# Patient Record
Sex: Male | Born: 1985
Health system: Southern US, Community
[De-identification: ages and names within clinical notes are randomized; demographics above are authoritative.]

## PROBLEM LIST (undated history)

## (undated) DIAGNOSIS — K219 Gastro-esophageal reflux disease without esophagitis: Secondary | ICD-10-CM

## (undated) HISTORY — DX: Gastro-esophageal reflux disease without esophagitis: K21.9

## (undated) HISTORY — PX: KNEE ARTHROSCOPY: SUR90

## (undated) HISTORY — PX: WISDOM TOOTH EXTRACTION: SHX21

## (undated) HISTORY — PX: VASECTOMY: SHX75

---

## 2013-01-17 ENCOUNTER — Ambulatory Visit: Payer: 59

## 2013-01-17 ENCOUNTER — Ambulatory Visit (INDEPENDENT_AMBULATORY_CARE_PROVIDER_SITE_OTHER): Payer: 59 | Admitting: Physician Assistant

## 2013-01-17 VITALS — BP 126/72 | HR 73 | Temp 98.6°F | Resp 16 | Ht 69.0 in | Wt 178.0 lb

## 2013-01-17 DIAGNOSIS — J189 Pneumonia, unspecified organism: Secondary | ICD-10-CM

## 2013-01-17 DIAGNOSIS — R059 Cough, unspecified: Secondary | ICD-10-CM

## 2013-01-17 DIAGNOSIS — R0602 Shortness of breath: Secondary | ICD-10-CM

## 2013-01-17 DIAGNOSIS — R05 Cough: Secondary | ICD-10-CM

## 2013-01-17 LAB — POCT CBC
Granulocyte percent: 72.1 %G (ref 37–80)
HCT, POC: 46.6 % (ref 43.5–53.7)
Hemoglobin: 14.6 g/dL (ref 14.1–18.1)
LYMPH, POC: 2.8 (ref 0.6–3.4)
MCH, POC: 29.3 pg (ref 27–31.2)
MCHC: 31.3 g/dL — AB (ref 31.8–35.4)
MCV: 93.3 fL (ref 80–97)
MID (cbc): 1.1 — AB (ref 0–0.9)
MPV: 9.2 fL (ref 0–99.8)
POC Granulocyte: 9.9 — AB (ref 2–6.9)
POC LYMPH %: 20.1 % (ref 10–50)
POC MID %: 7.8 %M (ref 0–12)
Platelet Count, POC: 280 10*3/uL (ref 142–424)
RBC: 4.99 M/uL (ref 4.69–6.13)
RDW, POC: 13 %
WBC: 13.8 10*3/uL — AB (ref 4.6–10.2)

## 2013-01-17 MED ORDER — ALBUTEROL SULFATE HFA 108 (90 BASE) MCG/ACT IN AERS: 2.0000 | INHALATION_SPRAY | RESPIRATORY_TRACT | Status: DC | PRN

## 2013-01-17 MED ORDER — LEVOFLOXACIN 500 MG PO TABS: 500.0000 mg | ORAL_TABLET | Freq: Every day | ORAL | Status: DC

## 2013-01-17 MED ORDER — HYDROCODONE-HOMATROPINE 5-1.5 MG/5ML PO SYRP: ORAL_SOLUTION | ORAL | Status: DC

## 2013-01-17 MED ORDER — CEFTRIAXONE SODIUM 1 G IJ SOLR
1.0000 g | Freq: Once | INTRAMUSCULAR | Status: AC
  Administered 2013-01-17: 1 g via INTRAMUSCULAR

## 2013-01-17 MED ORDER — E-Z SPACER DEVI: Status: DC

## 2013-01-17 NOTE — Progress Notes (Signed)
Subjective:    Patient ID: Alvin Johnson, male    DOB: 05-30-1985, 28 y.o.   MRN: [id number]  HPI 28 year old male presents with a 7 day history of nasal congestion, post nasal drip, sore throat, and cough. Mild sinus pressure. Subjective fever. No chills. Mild nasal congestion. Cough is productive of green/yellow sputum and worse first thing in the morning. Cough gets better as the day progresses. Some SOB. No wheezing. Some sinus pressure along the bilateral frontal sinuses. Ears feel full, leading to sensation of muffled hearing. Has tried OTC cold preps without success. Mild diarrhea for the first day or two. Appetite normal. Multiple sick contacts. No recent antibiotics, or recent travels.   No leg trauma, sedentary periods, h/o cancer, or tobacco use. No swelling in his legs.   He did receive an influenza vaccine this year.  PMH: History reviewed. No pertinent past medical history.   Home Meds: Prior to Admission medications   Not on File    Allergies: No Known Allergies  History   Social History  . Marital Status: Married    Spouse Name: N/A    Number of Children: N/A  . Years of Education: N/A   Occupational History  . Not on file.   Social History Main Topics  . Smoking status: Never Smoker   . Smokeless tobacco: Not on file  . Alcohol Use: Yes  . Drug Use: No  . Sexual Activity: Not on file   Other Topics Concern  . Not on file   Social History Narrative  . No narrative on file      Review of Systems  Constitutional: Positive for fever and fatigue. Negative for chills and appetite change.  HENT: Positive for congestion, hearing loss, postnasal drip, sinus pressure and sore throat. Negative for ear pain and rhinorrhea.   Respiratory: Positive for cough and shortness of breath. Negative for wheezing.   Gastrointestinal: Positive for diarrhea. Negative for nausea and vomiting.  Musculoskeletal: Negative for myalgias.  Neurological: Positive for headaches.        Objective:   Physical Exam  Physical Exam: Blood pressure 126/72, pulse 73, temperature 98.6 F (37 C), temperature source Oral, resp. rate 16, height 5\' 9"  (1.753 m), weight 178 lb (80.74 kg), SpO2 98.00%., Body mass index is 26.27 kg/(m^2). General: Well developed, well nourished, in no acute distress. No toxic appearing.  Head: Normocephalic, atraumatic, eyes without discharge, sclera non-icteric, nares are congested. Bilateral auditory canals clear, TM's are without perforation, pearly grey with reflective cone of light bilaterally. No sinus TTP. Oral cavity moist, dentition normal. Posterior pharynx with post nasal drip and mild erythema. No peritonsillar abscess or tonsillar exudate. Uvula midline.  Neck: Supple. No thyromegaly. Full ROM. No lymphadenopathy. Lungs: Coarse breath sounds right base without wheezes, rales, or rhonchi. Breathing is unlabored.  Heart: RRR with S1 S2. No murmurs, rubs, or gallops appreciated. Msk:  Strength and tone normal for age. Extremities: No clubbing or cyanosis. No edema. Neuro: Alert and oriented X 3. Moves all extremities spontaneously. CNII-XII grossly in tact. Psych:  Responds to questions appropriately with a normal affect.    Labs: Results for orders placed in visit on 01/17/13  POCT CBC      Result Value Range   WBC 13.8 (*) 4.6 - 10.2 K/uL   Lymph, poc 2.8  0.6 - 3.4   POC LYMPH PERCENT 20.1  10 - 50 %L   MID (cbc) 1.1 (*) 0 - 0.9   POC  MID % 7.8  0 - 12 %M   POC Granulocyte 9.9 (*) 2 - 6.9   Granulocyte percent 72.1  37 - 80 %G   RBC 4.99  4.69 - 6.13 M/uL   Hemoglobin 14.6  14.1 - 18.1 g/dL   HCT, POC 56.246.6  13.043.5 - 53.7 %   MCV 93.3  80 - 97 fL   MCH, POC 29.3  27 - 31.2 pg   MCHC 31.3 (*) 31.8 - 35.4 g/dL   RDW, POC 86.513.0     Platelet Count, POC 280  142 - 424 K/uL   MPV 9.2  0 - 99.8 fL    CXR:  [facility] reading (PRIMARY) by  Dr. Milus GlazierLauenstein. Increased bronchial markings and thickening suggestive of early infiltrate on  lateral view.       Assessment & Plan:  28 year old male with early PNA, cough, and SOB -Rocephin 1 gram IM -Levaquin 500 mg 1 po daily #10 no RF -Proventil 2 puffs inhaled q 4-6 hours prn #1 no RF -Hycodan #4oz 1 tsp po q 4-6 hours prn cough no RF SED -Mucinex -Rest/fluids -Recheck 24 -Plan to give Rocephin 2 more doses over the next 48 hours -RTC precautions -Out of work -Discussed with Dr. Clyda HurdleLauenstein    Natisha Trzcinski, PA-C [facility] [address] [place], [place] [address] [phone] 01/17/2013 8:27 PM

## 2013-01-20 ENCOUNTER — Ambulatory Visit (INDEPENDENT_AMBULATORY_CARE_PROVIDER_SITE_OTHER): Payer: 59 | Admitting: Family Medicine

## 2013-01-20 VITALS — BP 120/84 | HR 81 | Temp 98.1°F | Resp 18 | Ht 70.0 in | Wt 179.0 lb

## 2013-01-20 DIAGNOSIS — R079 Chest pain, unspecified: Secondary | ICD-10-CM

## 2013-01-20 DIAGNOSIS — R509 Fever, unspecified: Secondary | ICD-10-CM

## 2013-01-20 DIAGNOSIS — J189 Pneumonia, unspecified organism: Secondary | ICD-10-CM

## 2013-01-20 DIAGNOSIS — R0989 Other specified symptoms and signs involving the circulatory and respiratory systems: Secondary | ICD-10-CM

## 2013-01-20 DIAGNOSIS — R52 Pain, unspecified: Secondary | ICD-10-CM

## 2013-01-20 DIAGNOSIS — IMO0002 Reserved for concepts with insufficient information to code with codable children: Secondary | ICD-10-CM

## 2013-01-20 DIAGNOSIS — IMO0001 Reserved for inherently not codable concepts without codable children: Secondary | ICD-10-CM

## 2013-01-20 DIAGNOSIS — R0609 Other forms of dyspnea: Secondary | ICD-10-CM

## 2013-01-20 DIAGNOSIS — R059 Cough, unspecified: Secondary | ICD-10-CM

## 2013-01-20 DIAGNOSIS — R0689 Other abnormalities of breathing: Secondary | ICD-10-CM

## 2013-01-20 DIAGNOSIS — R0602 Shortness of breath: Secondary | ICD-10-CM

## 2013-01-20 DIAGNOSIS — R05 Cough: Secondary | ICD-10-CM

## 2013-01-20 DIAGNOSIS — R0600 Dyspnea, unspecified: Secondary | ICD-10-CM

## 2013-01-20 DIAGNOSIS — R058 Other specified cough: Secondary | ICD-10-CM

## 2013-01-20 LAB — POCT CBC
GRANULOCYTE PERCENT: 73.6 % (ref 37–80)
HCT, POC: 50.2 % (ref 43.5–53.7)
Hemoglobin: 16.1 g/dL (ref 14.1–18.1)
Lymph, poc: 1.8 (ref 0.6–3.4)
MCH, POC: 29.5 pg (ref 27–31.2)
MCHC: 32.1 g/dL (ref 31.8–35.4)
MCV: 92.2 fL (ref 80–97)
MID (CBC): 1.2 — AB (ref 0–0.9)
MPV: 9.6 fL (ref 0–99.8)
PLATELET COUNT, POC: 278 10*3/uL (ref 142–424)
POC Granulocyte: 8.3 — AB (ref 2–6.9)
POC LYMPH %: 15.6 % (ref 10–50)
POC MID %: 10.8 %M (ref 0–12)
RBC: 5.45 M/uL (ref 4.69–6.13)
RDW, POC: 12.4 %
WBC: 11.3 10*3/uL — AB (ref 4.6–10.2)

## 2013-01-20 LAB — POCT INFLUENZA A/B
Influenza A, POC: NEGATIVE
Influenza B, POC: NEGATIVE

## 2013-01-20 NOTE — Patient Instructions (Addendum)
Your blood count was better today, but still slightly elevated. This still may be a community acquired pneumonia or secondary viral illness. Continue mucinex during the day, hycodan at night if needed for cough. Inhaler only if helping shortness of breath. Continue levaquin each day, recheck in 2 days with Alvin Johnson on Monday at 6pm. Return to the clinic or go to the nearest emergency room if any of your symptoms worsen or new symptoms occur.

## 2013-01-20 NOTE — Progress Notes (Signed)
Subjective:    Patient ID: Alvin Johnson, male    DOB: [birth year], 28 y.o.   MRN: [id number]  HPI Alvin Johnson is a 28 y.o. male  Seen 3 days ago with 7 day hx of cough, congestion, productive cough, and subjective fever.   Lab Results  Component Value Date   WBC 13.8* 01/17/2013   HGB 14.6 01/17/2013   HCT 46.6 01/17/2013   MCV 93.3 01/17/2013   CXR report: FINDINGS: There is no edema or consolidation. Heart size and pulmonary  vascularity are normal. No adenopathy. No bone lesions. There is mild upper thoracic levoscoliosis.  IMPRESSION: Lungs clear.  Thought to have early PNA on lateral view in office reading. Given Rocephin 1 gram IM, started on  -Levaquin 500 mg 1 po daily #10, proventil, Hycodan  q 4-6 hours prn cough, mucinex, an plan for recheck at 24 hours.   Has been taking levaquin QD, tried inhaler (no hx of asthma). inhaler does not really help shortness of breath - taking it about every 4 hours. Feels short of breath lying down - both congestion in nose and chest, but no wheeze. Has had some upper right sided chest pain - noted most with cough, comes and goes. No known change with mvmt, unable to reproduce with palpation.  feels worse today with shortness of breath lying down, productive yellow cough (not using mucinex), able to eat and drinking lots of fluids. Body aches, chills yesterday, joint aches for past 3 days. Feels like throat swollen when swallowing.   No recent prolonged car travel or air travel, no recent calf pain or swelling.  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 No known sick contacts other than work related. Nonsmoker, did have flu vaccine this year.   Review of Systems  Constitutional: Negative for fever, chills and appetite change.  Respiratory: Positive for cough and shortness of breath. Negative for wheezing and stridor.   Cardiovascular: Positive for chest pain. Negative for palpitations and leg swelling.       Objective:   Physical Exam  Vitals  reviewed. Constitutional: He is oriented to person, place, and time. He appears well-developed and well-nourished.  HENT:  Head: Normocephalic and atraumatic.  Right Ear: Tympanic membrane, external ear and ear canal normal.  Left Ear: Tympanic membrane, external ear and ear canal normal.  Nose: No rhinorrhea.  Mouth/Throat: Oropharynx is clear and moist and mucous membranes are normal. No oropharyngeal exudate or posterior oropharyngeal erythema.  No post op erythema or exudate.   Eyes: Conjunctivae are normal. Pupils are equal, round, and reactive to light.  Neck: Neck supple.  No lad   Cardiovascular: Normal rate, regular rhythm, normal heart sounds and intact distal pulses.   No murmur heard. Pulmonary/Chest: Effort normal and breath sounds normal. No stridor. He has no wheezes. He has no rhonchi. He has no rales.  Clear.   Abdominal: Soft. There is no tenderness.  Lymphadenopathy:    He has no cervical adenopathy.  Neurological: He is alert and oriented to person, place, and time.  Skin: Skin is warm and dry. No rash noted.  Psychiatric: He has a normal mood and affect. His behavior is normal.   Filed Vitals:   01/20/13 0917  BP: 120/84  Pulse: 81  Temp: 98.1 F (36.7 C)  TempSrc: Oral  Resp: 18  Height: 5\' 10"  (1.778 m)  Weight: 179 lb (81.194 kg)  SpO2: 99%    Results for orders placed in visit on 01/20/13  POCT INFLUENZA A/B  Result Value Range   Influenza A, POC Negative     Influenza B, POC Negative    POCT CBC      Result Value Range   WBC 11.3 (*) 4.6 - 10.2 K/uL   Lymph, poc 1.8  0.6 - 3.4   POC LYMPH PERCENT 15.6  10 - 50 %L   MID (cbc) 1.2 (*) 0 - 0.9   POC MID % 10.8  0 - 12 %M   POC Granulocyte 8.3 (*) 2 - 6.9   Granulocyte percent 73.6  37 - 80 %G   RBC 5.45  4.69 - 6.13 M/uL   Hemoglobin 16.1  14.1 - 18.1 g/dL   HCT, POC 81.150.2  91.443.5 - 53.7 %   MCV 92.2  80 - 97 fL   MCH, POC 29.5  27 - 31.2 pg   MCHC 32.1  31.8 - 35.4 g/dL   RDW, POC 78.212.4      Platelet Count, POC 278  142 - 424 K/uL   MPV 9.6  0 - 99.8 fL      Assessment & Plan:  Alvin Johnson is a 28 y.o. male Body aches - Plan: POCT Influenza A/B  Cough productive of purulent sputum - Plan: POCT Influenza A/B, POCT CBC  Chest pain, unspecified - Plan: POCT Influenza A/B, POCT CBC  Fever with chills - Plan: POCT CBC  Shortness of breath - Plan: POCT CBC  Myalgia and myositis - Plan: POCT CBC  Dyspnea and respiratory abnormality - Plan: POCT CBC  CAP (community acquired pneumonia)   Suspected initial viral illness, then progression to bronchitis vs CAP, without visible infiltrate on initial CXR.  Improved/stable CBC, O2 sat wnl, and clear exam.  Will continue levaquin, hycodan, mucinex, and if needed for dyspnea - has inhaler. Tylenol or motrin if needed for f/c and body aches.  rtc precautions and plan to follow up with Eula Listenyan Dunn, PA-C in 2 days. Sooner if worse, out of work note through Wednesday if needed.    No orders of the defined types were placed in this encounter.   Patient Instructions  Your blood count was better today, but still slightly elevated. This still may be a community acquired pneumonia or secondary viral illness. Continue mucinex during the day, hycodan at night if needed for cough. Inhaler only if helping shortness of breath. Continue levaquin each day, recheck in 2 days with Eula Listenyan Dunn on Monday at 6pm. Return to the clinic or go to the nearest emergency room if any of your symptoms worsen or new symptoms occur.

## 2013-01-22 ENCOUNTER — Ambulatory Visit: Payer: 59

## 2013-01-22 ENCOUNTER — Ambulatory Visit (INDEPENDENT_AMBULATORY_CARE_PROVIDER_SITE_OTHER): Payer: 59 | Admitting: Physician Assistant

## 2013-01-22 VITALS — BP 118/72 | HR 72 | Temp 97.6°F | Resp 18 | Ht 69.0 in | Wt 178.0 lb

## 2013-01-22 DIAGNOSIS — R059 Cough, unspecified: Secondary | ICD-10-CM

## 2013-01-22 DIAGNOSIS — D7289 Other specified disorders of white blood cells: Secondary | ICD-10-CM

## 2013-01-22 DIAGNOSIS — J189 Pneumonia, unspecified organism: Secondary | ICD-10-CM

## 2013-01-22 DIAGNOSIS — R0602 Shortness of breath: Secondary | ICD-10-CM

## 2013-01-22 DIAGNOSIS — R05 Cough: Secondary | ICD-10-CM

## 2013-01-22 LAB — POCT CBC
GRANULOCYTE PERCENT: 78 % (ref 37–80)
HCT, POC: 49.8 % (ref 43.5–53.7)
HEMOGLOBIN: 15.9 g/dL (ref 14.1–18.1)
Lymph, poc: 3 (ref 0.6–3.4)
MCH, POC: 29.3 pg (ref 27–31.2)
MCHC: 31.9 g/dL (ref 31.8–35.4)
MCV: 91.9 fL (ref 80–97)
MID (cbc): 1 — AB (ref 0–0.9)
MPV: 8.9 fL (ref 0–99.8)
POC GRANULOCYTE: 14.3 — AB (ref 2–6.9)
POC LYMPH PERCENT: 16.5 %L (ref 10–50)
POC MID %: 5.5 %M (ref 0–12)
Platelet Count, POC: 318 10*3/uL (ref 142–424)
RBC: 5.42 M/uL (ref 4.69–6.13)
RDW, POC: 12.7 %
WBC: 18.3 10*3/uL — AB (ref 4.6–10.2)

## 2013-01-22 MED ORDER — CEFTRIAXONE SODIUM 1 G IJ SOLR
1.0000 g | Freq: Once | INTRAMUSCULAR | Status: AC
  Administered 2013-01-22: 1 g via INTRAMUSCULAR

## 2013-01-22 NOTE — Progress Notes (Signed)
Subjective:    Patient ID: Alvin Johnson, male    DOB: 1985/08/06, 28 y.o.   MRN: [id number]  HPI Primary Physician: Johny Blamer, MD  Chief Complaint: Follow up PNA  HPI: 28 y.o. male with history below presents for follow up of PNA. Patient initially evaluated on 01/17/13 with a 3 day history of cough, SOB especially when laying down, congestion, fevers, chills, myalgias, and fatigue. At that time he was found to have a WBC count of 13.8 and our reading of his CXR was increased bronchial markings suggestive of an early infiltrate. He was started on Levaquin 500 mg daily and given Rocephin 1 gram IM in office. He was advised to follow up for daily Rocephin injections, however that did not happen. He did follow up on 01/20/13 feeling worse. The Albuterol inhaler was not helping his SOB. He continued to feel SOB when laying down. Continued to have both congestion in his nose and chest. No wheezing. Cough was productive of yellow sputum. His appetite persisted and he was pushing fluids. His WBC count on 01/20/13 was 11.3.  At his planned follow up this evening this is the best he has felt throughout the above. He feels like he is improving. He cough is still productive a large quantities of yellow sputum. He is having to vomiting up the sputum at times. His SOB is improving. His fever, chills, congestion, and myalgias are all improving. He continues to take the Levaquin without issues. He is taking Mucinex. He is not using the Albuterol inhaler as he did not feel like this was helping him. His appetite remains strong and he is pushing fluids.     History reviewed. No pertinent past medical history.   Home Meds: Prior to Admission medications   Medication Sig Start Date End Date Taking? Authorizing Provider  albuterol (PROVENTIL HFA;VENTOLIN HFA) 108 (90 BASE) MCG/ACT inhaler Inhale 2 puffs into the lungs every 4 (four) hours as needed for wheezing or shortness of breath. 01/17/13  Yes Brittani Purdum M Symphoni Helbling,  PA-C  HYDROcodone-homatropine [facility]) 5-1.5 MG/5ML syrup 1 TSP PO Q 4-6 HOURS PRN COUGH 01/17/13  Yes Sherrica Niehaus M Deondra Labrador, PA-C  levofloxacin (LEVAQUIN) 500 MG tablet Take 1 tablet (500 mg total) by mouth daily. 01/17/13  Yes Abdur Hoglund M Suda Forbess, PA-C  Spacer/Aero-Holding Chambers (E-Z SPACER) inhaler Use as instructed 01/17/13  Yes Sondra Barges, PA-C    Allergies: No Known Allergies  History   Social History  . Marital Status: Married    Spouse Name: N/A    Number of Children: N/A  . Years of Education: N/A   Occupational History  . Not on file.   Social History Main Topics  . Smoking status: Never Smoker   . Smokeless tobacco: Not on file  . Alcohol Use: Yes  . Drug Use: No  . Sexual Activity: Not on file   Other Topics Concern  . Not on file   Social History Narrative  . No narrative on file       Review of Systems  Constitutional: Positive for fever, chills and fatigue.  HENT: Positive for congestion.   Respiratory: Positive for cough and shortness of breath. Negative for wheezing.   Gastrointestinal: Positive for vomiting. Negative for nausea and diarrhea.  Musculoskeletal: Positive for myalgias.       Objective:   Physical Exam  Physical Exam: Blood pressure 118/72, pulse 72, temperature 97.6 F (36.4 C), temperature source Oral, resp. rate 18, height 5\' 9"  (1.753 m), weight 178  lb (80.74 kg), SpO2 97.00%., Body mass index is 26.27 kg/(m^2). General: Well developed, well nourished, in no acute distress. Not toxic appearing.  Head: Normocephalic, atraumatic, eyes without discharge, sclera non-icteric, nares are congested. Bilateral auditory canals clear, TM's are without perforation, pearly grey with reflective cone of light bilaterally. No sinus TTP. Oral cavity moist, dentition normal. Posterior pharynx with post nasal drip and mild erythema. No peritonsillar abscess or tonsillar exudate. Uvula midline.  Neck: Supple. No thyromegaly. Full ROM. No lymphadenopathy. Lungs: Clear  to auscultation bilaterally without wheezes, rales, or rhonchi. Breathing is unlabored.  Heart: RRR with S1 S2. No murmurs, rubs, or gallops appreciated. Msk:  Strength and tone normal for age. Extremities: No clubbing or cyanosis. No edema. Neuro: Alert and oriented X 3. Moves all extremities spontaneously. CNII-XII grossly in tact. Psych:  Responds to questions appropriately with a normal affect.   Labs: Results for orders placed in visit on 01/22/13  POCT CBC      Result Value Range   WBC 18.3 (*) 4.6 - 10.2 K/uL   Lymph, poc 3.0  0.6 - 3.4   POC LYMPH PERCENT 16.5  10 - 50 %L   MID (cbc) 1.0 (*) 0 - 0.9   POC MID % 5.5  0 - 12 %M   POC Granulocyte 14.3 (*) 2 - 6.9   Granulocyte percent 78.0  37 - 80 %G   RBC 5.42  4.69 - 6.13 M/uL   Hemoglobin 15.9  14.1 - 18.1 g/dL   HCT, POC 29.549.8  62.143.5 - 53.7 %   MCV 91.9  80 - 97 fL   MCH, POC 29.3  27 - 31.2 pg   MCHC 31.9  31.8 - 35.4 g/dL   RDW, POC 30.812.7     Platelet Count, POC 318  142 - 424 K/uL   MPV 8.9  0 - 99.8 fL    CXR:  [facility] reading (PRIMARY) by  Dr. Alwyn RenHopper. Improving infiltrates.       Assessment & Plan:  28 year old male with PNA, cough, SOB, fever, leukocytosis, and fatigue  -Clinically improved -Improved and reassuring CXR -Rocephin 1 gram IM -Continue Levaquin -Continue Mucinex  -Continue Hycodan  -Has Albuterol inhaler if needed  -Continue out of work until recheck -Recheck 48 hours, sooner if needed   Eula Listenyan Felipe Cabell, PA-C [facility] [address] [place], [place] [address] [phone] 01/23/2013 11:11 AM

## 2013-01-24 ENCOUNTER — Ambulatory Visit (INDEPENDENT_AMBULATORY_CARE_PROVIDER_SITE_OTHER): Payer: 59 | Admitting: Physician Assistant

## 2013-01-24 VITALS — BP 126/78 | HR 95 | Temp 98.5°F | Resp 17 | Ht 71.0 in | Wt 179.0 lb

## 2013-01-24 DIAGNOSIS — R05 Cough: Secondary | ICD-10-CM

## 2013-01-24 DIAGNOSIS — R059 Cough, unspecified: Secondary | ICD-10-CM

## 2013-01-24 DIAGNOSIS — R0602 Shortness of breath: Secondary | ICD-10-CM

## 2013-01-24 DIAGNOSIS — J189 Pneumonia, unspecified organism: Secondary | ICD-10-CM

## 2013-01-24 DIAGNOSIS — D7289 Other specified disorders of white blood cells: Secondary | ICD-10-CM

## 2013-01-24 LAB — POCT CBC
Granulocyte percent: 55.5 %G (ref 37–80)
HEMATOCRIT: 40 % — AB (ref 43.5–53.7)
HEMOGLOBIN: 12.9 g/dL — AB (ref 14.1–18.1)
LYMPH, POC: 2.7 (ref 0.6–3.4)
MCH: 29.3 pg (ref 27–31.2)
MCHC: 32.2 g/dL (ref 31.8–35.4)
MCV: 90.8 fL (ref 80–97)
MID (cbc): 0.8 (ref 0–0.9)
MPV: 8.9 fL (ref 0–99.8)
POC GRANULOCYTE: 4.3 (ref 2–6.9)
POC LYMPH %: 34.5 % (ref 10–50)
POC MID %: 10 %M (ref 0–12)
Platelet Count, POC: 268 10*3/uL (ref 142–424)
RBC: 4.41 M/uL — AB (ref 4.69–6.13)
RDW, POC: 12.2 %
WBC: 7.7 10*3/uL (ref 4.6–10.2)

## 2013-01-24 NOTE — Progress Notes (Signed)
Subjective:    Patient ID: Alvin Johnson, male    DOB: 03/16/1985, 28 y.o.   MRN: [id number]  HPI Primary Physician: Johny Blamer, MD  Chief Complaint: Follow up PNA, SOB, cough, fatigue, and leukocytosis  HPI: 28 y.o. male with history below presents for follow up of the above. Feels much better. Notes a significant improvement in his energy, cough, and SOB. Not coughing anywhere nearly as much. When he does cough it is not as productive as previous either. He does still get some sputum up at times. He was able to get around and clean his house for 2-3 hours the previous day he states and he felt fine while doing so as well as afterwards. Some amount of SOB today, but this too is an improvement from previous. Afebrile. No chills. No myalgias. Still some nasal congestion, rhinorrhea, post nasal drip, and sinus pressure that wax and wane. These too are improved. Appetite remains strong. Continues to push fluids. He would like to return back to work on 01/25/13. He feels like he would be able to do so. He does have some long shift coming up this weekend, but if he ends to he can take a break from them.     No past medical history on file.   Home Meds: Prior to Admission medications   Medication Sig Start Date End Date Taking? Authorizing Provider  albuterol (PROVENTIL HFA;VENTOLIN HFA) 108 (90 BASE) MCG/ACT inhaler Inhale 2 puffs into the lungs every 4 (four) hours as needed for wheezing or shortness of breath. 01/17/13  Yes Sabra Sessler M Johnnie Goynes, PA-C  HYDROcodone-homatropine [facility]) 5-1.5 MG/5ML syrup 1 TSP PO Q 4-6 HOURS PRN COUGH 01/17/13  Yes Lizzett Nobile M Olsen Mccutchan, PA-C  levofloxacin (LEVAQUIN) 500 MG tablet Take 1 tablet (500 mg total) by mouth daily. 01/17/13  Yes Kamarian Sahakian M Yisel Megill, PA-C  Spacer/Aero-Holding Chambers (E-Z SPACER) inhaler Use as instructed 01/17/13  Yes Sondra Barges, PA-C    Allergies: No Known Allergies  History   Social History  . Marital Status: Married    Spouse Name: N/A    Number of  Children: N/A  . Years of Education: N/A   Occupational History  . Not on file.   Social History Main Topics  . Smoking status: Never Smoker   . Smokeless tobacco: Not on file  . Alcohol Use: Yes  . Drug Use: No  . Sexual Activity: Not on file   Other Topics Concern  . Not on file   Social History Narrative  . No narrative on file       Review of Systems  Constitutional: Negative for fever, chills, appetite change and fatigue.  HENT: Positive for postnasal drip, rhinorrhea and sinus pressure. Negative for ear pain, hearing loss, sneezing and sore throat.   Respiratory: Positive for cough and shortness of breath. Negative for wheezing.   Gastrointestinal: Negative for nausea, vomiting and diarrhea.  Musculoskeletal: Negative for myalgias.  Neurological: Negative for headaches.       Objective:   Physical Exam  Physical Exam: Blood pressure 126/78, pulse 95, temperature 98.5 F (36.9 C), temperature source Oral, resp. rate 17, height 5\' 11"  (1.803 m), weight 179 lb (81.194 kg), SpO2 99.00%., Body mass index is 24.98 kg/(m^2). General: Well developed, well nourished, in no acute distress. Head: Normocephalic, atraumatic, eyes without discharge, sclera non-icteric, nares are patent. Bilateral auditory canals clear, TM's are without perforation, pearly grey with reflective cone of light bilaterally. No sinus TTP. Oral cavity moist, dentition normal.  Posterior pharynx with post nasal drip. No erythema, peritonsillar abscess, or tonsillar exudate. Uvula midline.  Neck: Supple. No thyromegaly. Full ROM. No lymphadenopathy. Lungs: Coarse breath sounds bilaterally without wheezes, rales, or rhonchi. Breathing is unlabored.  Heart: RRR with S1 S2. No murmurs, rubs, or gallops appreciated. Msk:  Strength and tone normal for age. Extremities: No clubbing or cyanosis. No edema. Neuro: Alert and oriented X 3. Moves all extremities spontaneously. CNII-XII grossly in tact. Psych:   Responds to questions appropriately with a normal affect.    Labs: Results for orders placed in visit on 01/24/13  POCT CBC      Result Value Range   WBC 7.7  4.6 - 10.2 K/uL   Lymph, poc 2.7  0.6 - 3.4   POC LYMPH PERCENT 34.5  10 - 50 %L   MID (cbc) 0.8  0 - 0.9   POC MID % 10.0  0 - 12 %M   POC Granulocyte 4.3  2 - 6.9   Granulocyte percent 55.5  37 - 80 %G   RBC 4.41 (*) 4.69 - 6.13 M/uL   Hemoglobin 12.9 (*) 14.1 - 18.1 g/dL   HCT, POC 16.140.0 (*) 09.643.5 - 53.7 %   MCV 90.8  80 - 97 fL   MCH, POC 29.3  27 - 31.2 pg   MCHC 32.2  31.8 - 35.4 g/dL   RDW, POC 04.512.2     Platelet Count, POC 268  142 - 424 K/uL   MPV 8.9  0 - 99.8 fL   Previous WBC counts:  01/17/13: 13.8 01/20/13: 11.3 01/22/13: 18.3 01/24/13: 7.7     Assessment & Plan:  28 year old male with resolving PNA, SOB, cough, fatigue, and resolved leukocytosis -Much improved  -Finish Levaquin -Patient's energy level greatly improved over the past 24 hours. Advised him to continue to advance his activity as tolerated. He agrees with this.  -He is cleared to RTW on 01/25/13 -Follow up 1 month to make sure he has returned back to baseline    Eula Listenyan Alaiya Martindelcampo, [facility] [address] [place], [place] [address] [phone] 01/24/2013 7:25 PM

## 2013-03-30 ENCOUNTER — Ambulatory Visit (INDEPENDENT_AMBULATORY_CARE_PROVIDER_SITE_OTHER): Payer: 59 | Admitting: Emergency Medicine

## 2013-03-30 VITALS — BP 135/75 | HR 70 | Temp 97.9°F | Resp 16 | Ht 69.0 in | Wt 172.6 lb

## 2013-03-30 DIAGNOSIS — S335XXA Sprain of ligaments of lumbar spine, initial encounter: Secondary | ICD-10-CM

## 2013-03-30 MED ORDER — ACETAMINOPHEN-CODEINE #3 300-30 MG PO TABS: 1.0000 | ORAL_TABLET | ORAL | Status: DC | PRN

## 2013-03-30 MED ORDER — CYCLOBENZAPRINE HCL 5 MG PO TABS: 5.0000 mg | ORAL_TABLET | Freq: Three times a day (TID) | ORAL | Status: DC | PRN

## 2013-03-30 MED ORDER — NAPROXEN SODIUM 550 MG PO TABS: 550.0000 mg | ORAL_TABLET | Freq: Two times a day (BID) | ORAL | Status: DC

## 2013-03-30 NOTE — Progress Notes (Signed)
[facility] [address], [place] [address] [phone]  Date:  03/30/2013   Name:  Alvin Johnson   DOB:  05/01/1985   MRN:  [id number]  PCP:  Johny BlamerHARRIS, WILLIAM, MD    Chief Complaint: Back Pain   History of Present Illness:  Alvin Johnson is a 28 y.o. very pleasant male patient who presents with the following:  Says he woke up yesterday with pain in the low back.  Pain is not radiating and has no neuro symptoms or weakness in the lower extremities.  No history of injury.  Says he got on his inversion table that has helped his pain in the past but he had no relief.  The back pain interfered with sleeping.  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  No improvement with over the counter medications or other home remedies. Denies other complaint or health concern today.   There are no active problems to display for this patient.   No past medical history on file.  No past surgical history on file.  History  Substance Use Topics  . Smoking status: Never Smoker   . Smokeless tobacco: Not on file  . Alcohol Use: Yes    Family History  Problem Relation Age of Onset  . Hyperlipidemia Father   . Hypertension Father     No Known Allergies  Medication list has been reviewed and updated.  Current Outpatient Prescriptions on File Prior to Visit  Medication Sig Dispense Refill  . albuterol (PROVENTIL HFA;VENTOLIN HFA) 108 (90 BASE) MCG/ACT inhaler Inhale 2 puffs into the lungs every 4 (four) hours as needed for wheezing or shortness of breath.  1 Inhaler  1  . HYDROcodone-homatropine (HYCODAN) 5-1.5 MG/5ML syrup 1 TSP PO Q 4-6 HOURS PRN COUGH  120 mL  0  . levofloxacin (LEVAQUIN) 500 MG tablet Take 1 tablet (500 mg total) by mouth daily.  10 tablet  0  . Spacer/Aero-Holding Chambers (E-Z SPACER) inhaler Use as instructed  1 each  2   No current facility-administered medications on file prior to visit.    Review of  Systems:  As per HPI, otherwise negative.    Physical Examination: Filed Vitals:   03/30/13 1627  BP: 135/75  Pulse: 70  Temp: 97.9 F (36.6 C)  Resp: 16   Filed Vitals:   03/30/13 1627  Height: 5\' 9"  (1.753 m)  Weight: 172 lb 9.6 oz (78.291 kg)   Body mass index is 25.48 kg/(m^2). Ideal Body Weight: Weight in (lb) to have BMI = 25: 168.9   GEN: WDWN, NAD, Non-toxic, Alert & Oriented x 3 HEENT: Atraumatic, Normocephalic.  Ears and Nose: No external deformity. EXTR: No clubbing/cyanosis/edema NEURO: Normal gait.  PSYCH: Normally interactive. Conversant. Not depressed or anxious appearing.  Calm demeanor.  Back:  No tenderness. Some spasm of the para spinal muscles.  Neuro intact  Assessment and Plan: Lumbar strain Anaprox Flexeril Tyl #3  Signed,  Phillips OdorJeffery Jacquelyne Quarry, MD

## 2013-03-30 NOTE — Patient Instructions (Signed)
Lumbosacral Strain Lumbosacral strain is a strain of any of the parts that make up your lumbosacral vertebrae. Your lumbosacral vertebrae are the bones that make up the lower third of your backbone. Your lumbosacral vertebrae are held together by muscles and tough, fibrous tissue (ligaments).  CAUSES  A sudden blow to your back can cause lumbosacral strain. Also, anything that causes an excessive stretch of the muscles in the low back can cause this strain. This is typically seen when people exert themselves strenuously, fall, lift heavy objects, bend, or crouch repeatedly. RISK FACTORS  Physically demanding work.  Participation in pushing or pulling sports or sports that require sudden twist of the back (tennis, golf, baseball).  Weight lifting.  Excessive lower back curvature.  Forward-tilted pelvis.  Weak back or abdominal muscles or both.  Tight hamstrings. SIGNS AND SYMPTOMS  Lumbosacral strain may cause pain in the area of your injury or pain that moves (radiates) down your leg.  DIAGNOSIS Your health care provider can often diagnose lumbosacral strain through a physical exam. In some cases, you may need tests such as X-ray exams.  TREATMENT  Treatment for your lower back injury depends on many factors that your clinician will have to evaluate. However, most treatment will include the use of anti-inflammatory medicines. HOME CARE INSTRUCTIONS   Avoid hard physical activities (tennis, racquetball, waterskiing) if you are not in proper physical condition for it. This may aggravate or create problems.  If you have a back problem, avoid sports requiring sudden body movements. Swimming and walking are generally safer activities.  Maintain good posture.  Maintain a healthy weight.  For acute conditions, you may put ice on the injured area.  Put ice in a plastic bag.  Place a towel between your skin and the bag.  Leave the ice on for 20 minutes, 2 3 times a day.  When the  low back starts healing, stretching and strengthening exercises may be recommended. SEEK MEDICAL CARE IF:  Your back pain is getting worse.  You experience severe back pain not relieved with medicines. SEEK IMMEDIATE MEDICAL CARE IF:   You have numbness, tingling, weakness, or problems with the use of your arms or legs.  There is a change in bowel or bladder control.  You have increasing pain in any area of the body, including your belly (abdomen).  You notice shortness of breath, dizziness, or feel faint.  You feel sick to your stomach (nauseous), are throwing up (vomiting), or become sweaty.  You notice discoloration of your toes or legs, or your feet get very cold. MAKE SURE YOU:   Understand these instructions.  Will watch your condition.  Will get help right away if you are not doing well or get worse. Document Released: 10/07/2004 Document Revised: 10/18/2012 Document Reviewed: 08/16/2012 [employer] Patient Information 2014 [employer].  

## 2013-04-05 ENCOUNTER — Ambulatory Visit (INDEPENDENT_AMBULATORY_CARE_PROVIDER_SITE_OTHER): Payer: 59 | Admitting: Emergency Medicine

## 2013-04-05 VITALS — BP 120/80 | HR 86 | Temp 98.4°F | Resp 16 | Ht 69.5 in | Wt 172.0 lb

## 2013-04-05 DIAGNOSIS — M722 Plantar fascial fibromatosis: Secondary | ICD-10-CM

## 2013-04-05 DIAGNOSIS — S335XXA Sprain of ligaments of lumbar spine, initial encounter: Secondary | ICD-10-CM

## 2013-04-05 NOTE — Patient Instructions (Signed)
Lumbosacral Strain Lumbosacral strain is a strain of any of the parts that make up your lumbosacral vertebrae. Your lumbosacral vertebrae are the bones that make up the lower third of your backbone. Your lumbosacral vertebrae are held together by muscles and tough, fibrous tissue (ligaments).  CAUSES  A sudden blow to your back can cause lumbosacral strain. Also, anything that causes an excessive stretch of the muscles in the low back can cause this strain. This is typically seen when people exert themselves strenuously, fall, lift heavy objects, bend, or crouch repeatedly. RISK FACTORS  Physically demanding work.  Participation in pushing or pulling sports or sports that require sudden twist of the back (tennis, golf, baseball).  Weight lifting.  Excessive lower back curvature.  Forward-tilted pelvis.  Weak back or abdominal muscles or both.  Tight hamstrings. SIGNS AND SYMPTOMS  Lumbosacral strain may cause pain in the area of your injury or pain that moves (radiates) down your leg.  DIAGNOSIS Your health care provider can often diagnose lumbosacral strain through a physical exam. In some cases, you may need tests such as X-ray exams.  TREATMENT  Treatment for your lower back injury depends on many factors that your clinician will have to evaluate. However, most treatment will include the use of anti-inflammatory medicines. HOME CARE INSTRUCTIONS   Avoid hard physical activities (tennis, racquetball, waterskiing) if you are not in proper physical condition for it. This may aggravate or create problems.  If you have a back problem, avoid sports requiring sudden body movements. Swimming and walking are generally safer activities.  Maintain good posture.  Maintain a healthy weight.  For acute conditions, you may put ice on the injured area.  Put ice in a plastic bag.  Place a towel between your skin and the bag.  Leave the ice on for 20 minutes, 2 3 times a day.  When the  low back starts healing, stretching and strengthening exercises may be recommended. SEEK MEDICAL CARE IF:  Your back pain is getting worse.  You experience severe back pain not relieved with medicines. SEEK IMMEDIATE MEDICAL CARE IF:   You have numbness, tingling, weakness, or problems with the use of your arms or legs.  There is a change in bowel or bladder control.  You have increasing pain in any area of the body, including your belly (abdomen).  You notice shortness of breath, dizziness, or feel faint.  You feel sick to your stomach (nauseous), are throwing up (vomiting), or become sweaty.  You notice discoloration of your toes or legs, or your feet get very cold. MAKE SURE YOU:   Understand these instructions.  Will watch your condition.  Will get help right away if you are not doing well or get worse. Document Released: 10/07/2004 Document Revised: 10/18/2012 Document Reviewed: 08/16/2012 [employer] Patient Information 2014 [employer].  

## 2013-04-05 NOTE — Progress Notes (Signed)
[facility] [address], [place] [address] [phone]  Date:  04/05/2013   Name:  Alvin SnookJames Lefebre   DOB:  February 19, 1985   MRN:  [id number]  PCP:  Johny BlamerHARRIS, WILLIAM, MD    Chief Complaint: Follow-up   History of Present Illness:  Alvin Johnson is a 28 y.o. very pleasant male patient who presents with the following:  Seen a week ago with low back pain. Since then had 2 days of burning pain in his anterior legs in to the feet.  Resolved no radicular pain currently nor weakness.  Wants to have an xray to see if he has a disc injury.  Little consistent improvement with medication.  Pain worse with work.  Improved following massage  No history of injury.  No improvement with over the counter medications or other home remedies. Denies other complaint or health concern today.   There are no active problems to display for this patient.   History reviewed. No pertinent past medical history.  History reviewed. No pertinent past surgical history.  History  Substance Use Topics  . Smoking status: Never Smoker   . Smokeless tobacco: Not on file  . Alcohol Use: Yes    Family History  Problem Relation Age of Onset  . Hyperlipidemia Father   . Hypertension Father     No Known Allergies  Medication list has been reviewed and updated.  Current Outpatient Prescriptions on File Prior to Visit  Medication Sig Dispense Refill  . cyclobenzaprine (FLEXERIL) 5 MG tablet Take 1 tablet (5 mg total) by mouth 3 (three) times daily as needed for muscle spasms.  45 tablet  1  . naproxen sodium (ANAPROX DS) 550 MG tablet Take 1 tablet (550 mg total) by mouth 2 (two) times daily with a meal.  40 tablet  0  . acetaminophen-codeine (TYLENOL #3) 300-30 MG per tablet Take 1-2 tablets by mouth every 4 (four) hours as needed.  30 tablet  0  . albuterol (PROVENTIL HFA;VENTOLIN HFA) 108 (90 BASE) MCG/ACT inhaler Inhale 2 puffs into the lungs every 4 (four) hours as needed for wheezing or  shortness of breath.  1 Inhaler  1  . HYDROcodone-homatropine (HYCODAN) 5-1.5 MG/5ML syrup 1 TSP PO Q 4-6 HOURS PRN COUGH  120 mL  0  . levofloxacin (LEVAQUIN) 500 MG tablet Take 1 tablet (500 mg total) by mouth daily.  10 tablet  0  . Spacer/Aero-Holding Chambers (E-Z SPACER) inhaler Use as instructed  1 each  2   No current facility-administered medications on file prior to visit.    Review of Systems:  As per HPI, otherwise negative.    Physical Examination: Filed Vitals:   04/05/13 1350  BP: 120/80  Pulse: 86  Temp: 98.4 F (36.9 C)  Resp: 16   Filed Vitals:   04/05/13 1350  Height: 5' 9.5" (1.765 m)  Weight: 172 lb (78.019 kg)   Body mass index is 25.04 kg/(m^2). Ideal Body Weight: Weight in (lb) to have BMI = 25: 171.4   GEN: WDWN, NAD, Non-toxic, Alert & Oriented x 3 HEENT: Atraumatic, Normocephalic.  Ears and Nose: No external deformity. EXTR: No clubbing/cyanosis/edema NEURO: Normal gait.  PSYCH: Normally interactive. Conversant. Not depressed or anxious appearing.  Calm demeanor.  BACK:  Tender over lumbar spine.  No paraspinous spasm.  Neuro intact  Assessment and Plan: Back strain PT Continue meds  Signed,  Phillips OdorJeffery Shahida Schnackenberg, MD

## 2013-04-06 ENCOUNTER — Encounter: Payer: Self-pay | Admitting: Podiatrist

## 2013-04-19 ENCOUNTER — Telehealth: Payer: Self-pay | Admitting: *Deleted

## 2013-04-19 NOTE — Telephone Encounter (Signed)
Calling to see if my refurbished orthotics are back yet.  I returned his call and informed him they are not here yet.  We will contact him when they come in.

## 2013-04-23 ENCOUNTER — Telehealth: Payer: Self-pay | Admitting: *Deleted

## 2013-04-23 NOTE — Telephone Encounter (Signed)
Have my orthotics come in yet?  I left him a message that orthotics are not here.  We will contact him when they come in.

## 2013-04-30 ENCOUNTER — Encounter: Payer: Self-pay | Admitting: Podiatrist

## 2013-05-24 ENCOUNTER — Emergency Department (HOSPITAL_COMMUNITY)
Admission: EM | Admit: 2013-05-24 | Discharge: 2013-05-24 | Disposition: A | Discharge status: Home / Self Care | Payer: 59 | Attending: Emergency Medicine | Admitting: Emergency Medicine

## 2013-05-24 ENCOUNTER — Emergency Department (HOSPITAL_COMMUNITY): Payer: 59

## 2013-05-24 ENCOUNTER — Encounter (HOSPITAL_COMMUNITY): Payer: Self-pay | Admitting: Emergency Medicine

## 2013-05-24 DIAGNOSIS — R209 Unspecified disturbances of skin sensation: Secondary | ICD-10-CM | POA: Insufficient documentation

## 2013-05-24 DIAGNOSIS — R51 Headache: Secondary | ICD-10-CM | POA: Insufficient documentation

## 2013-05-24 DIAGNOSIS — R519 Headache, unspecified: Secondary | ICD-10-CM

## 2013-05-24 MED ORDER — KETOROLAC TROMETHAMINE 30 MG/ML IJ SOLN
60.0000 mg | Freq: Once | INTRAMUSCULAR | Status: AC
  Administered 2013-05-24: 60 mg via INTRAMUSCULAR
  Filled 2013-05-24: qty 2

## 2013-05-24 NOTE — ED Notes (Signed)
Pt states around midnight he laid he head back "felt a flick" in his head and now is having a headache and some facial numbness.  Pt has equal grip strength and no facial droop, or slurred speech noted

## 2013-05-24 NOTE — ED Notes (Signed)
Patient transported to CT 

## 2013-05-24 NOTE — Discharge Instructions (Signed)
Use Tylenol or ibuprofen for headache.  Return to the emergency department for worsening condition or new concerning symptoms.   Headaches, Frequently Asked Questions MIGRAINE HEADACHES Q: What is migraine? What causes it? How can I treat it? A: Generally, migraine headaches begin as a dull ache. Then they develop into a constant, throbbing, and pulsating pain. You may experience pain at the temples. You may experience pain at the front or back of one or both sides of the head. The pain is usually accompanied by a combination of:  Nausea.  Vomiting.  Sensitivity to light and noise. Some people (about 15%) experience an aura (see below) before an attack. The cause of migraine is believed to be chemical reactions in the brain. Treatment for migraine may include over-the-counter or prescription medications. It may also include self-help techniques. These include relaxation training and biofeedback.  Q: What is an aura? A: About 15% of people with migraine get an "aura". This is a sign of neurological symptoms that occur before a migraine headache. You may see wavy or jagged lines, dots, or flashing lights. You might experience tunnel vision or blind spots in one or both eyes. The aura can include visual or auditory hallucinations (something imagined). It may include disruptions in smell (such as strange odors), taste or touch. Other symptoms include:  Numbness.  A "pins and needles" sensation.  Difficulty in recalling or speaking the correct word. These neurological events may last as long as 60 minutes. These symptoms will fade as the headache begins. Q: What is a trigger? A: Certain physical or environmental factors can lead to or "trigger" a migraine. These include:  Foods.  Hormonal changes.  Weather.  Stress. It is important to remember that triggers are different for everyone. To help prevent migraine attacks, you need to figure out which triggers affect you. Keep a headache  diary. This is a good way to track triggers. The diary will help you talk to your healthcare professional about your condition. Q: Does weather affect migraines? A: Bright sunshine, hot, humid conditions, and drastic changes in barometric pressure may lead to, or "trigger," a migraine attack in some people. But studies have shown that weather does not act as a trigger for everyone with migraines. Q: What is the link between migraine and hormones? A: Hormones start and regulate many of your body's functions. Hormones keep your body in balance within a constantly changing environment. The levels of hormones in your body are unbalanced at times. Examples are during menstruation, pregnancy, or menopause. That can lead to a migraine attack. In fact, about three quarters of all women with migraine report that their attacks are related to the menstrual cycle.  Q: Is there an increased risk of stroke for migraine sufferers? A: The likelihood of a migraine attack causing a stroke is very remote. That is not to say that migraine sufferers cannot have a stroke associated with their migraines. In persons under age 27, the most common associated factor for stroke is migraine headache. But over the course of a person's normal life span, the occurrence of migraine headache may actually be associated with a reduced risk of dying from cerebrovascular disease due to stroke.  Q: What are acute medications for migraine? A: Acute medications are used to treat the pain of the headache after it has started. Examples over-the-counter medications, NSAIDs, ergots, and triptans.  Q: What are the triptans? A: Triptans are the newest class of abortive medications. They are specifically targeted to treat migraine.  Triptans are vasoconstrictors. They moderate some chemical reactions in the brain. The triptans work on receptors in your brain. Triptans help to restore the balance of a neurotransmitter called serotonin. Fluctuations in  levels of serotonin are thought to be a main cause of migraine.  Q: Are over-the-counter medications for migraine effective? A: Over-the-counter, or "OTC," medications may be effective in relieving mild to moderate pain and associated symptoms of migraine. But you should see your caregiver before beginning any treatment regimen for migraine.  Q: What are preventive medications for migraine? A: Preventive medications for migraine are sometimes referred to as "prophylactic" treatments. They are used to reduce the frequency, severity, and length of migraine attacks. Examples of preventive medications include antiepileptic medications, antidepressants, beta-blockers, calcium channel blockers, and NSAIDs (nonsteroidal anti-inflammatory drugs). Q: Why are anticonvulsants used to treat migraine? A: During the past few years, there has been an increased interest in antiepileptic drugs for the prevention of migraine. They are sometimes referred to as "anticonvulsants". Both epilepsy and migraine may be caused by similar reactions in the brain.  Q: Why are antidepressants used to treat migraine? A: Antidepressants are typically used to treat people with depression. They may reduce migraine frequency by regulating chemical levels, such as serotonin, in the brain.  Q: What alternative therapies are used to treat migraine? A: The term "alternative therapies" is often used to describe treatments considered outside the scope of conventional Western medicine. Examples of alternative therapy include acupuncture, acupressure, and yoga. Another common alternative treatment is herbal therapy. Some herbs are believed to relieve headache pain. Always discuss alternative therapies with your caregiver before proceeding. Some herbal products contain arsenic and other toxins. TENSION HEADACHES Q: What is a tension-type headache? What causes it? How can I treat it? A: Tension-type headaches occur randomly. They are often the  result of temporary stress, anxiety, fatigue, or anger. Symptoms include soreness in your temples, a tightening band-like sensation around your head (a "vice-like" ache). Symptoms can also include a pulling feeling, pressure sensations, and contracting head and neck muscles. The headache begins in your forehead, temples, or the back of your head and neck. Treatment for tension-type headache may include over-the-counter or prescription medications. Treatment may also include self-help techniques such as relaxation training and biofeedback. CLUSTER HEADACHES Q: What is a cluster headache? What causes it? How can I treat it? A: Cluster headache gets its name because the attacks come in groups. The pain arrives with little, if any, warning. It is usually on one side of the head. A tearing or bloodshot eye and a runny nose on the same side of the headache may also accompany the pain. Cluster headaches are believed to be caused by chemical reactions in the brain. They have been described as the most severe and intense of any headache type. Treatment for cluster headache includes prescription medication and oxygen. SINUS HEADACHES Q: What is a sinus headache? What causes it? How can I treat it? A: When a cavity in the bones of the face and skull (a sinus) becomes inflamed, the inflammation will cause localized pain. This condition is usually the result of an allergic reaction, a tumor, or an infection. If your headache is caused by a sinus blockage, such as an infection, you will probably have a fever. An x-ray will confirm a sinus blockage. Your caregiver's treatment might include antibiotics for the infection, as well as antihistamines or decongestants.  REBOUND HEADACHES Q: What is a rebound headache? What causes it? How can I  treat it? A: A pattern of taking acute headache medications too often can lead to a condition known as "rebound headache." A pattern of taking too much headache medication includes taking  it more than 2 days per week or in excessive amounts. That means more than the label or a caregiver advises. With rebound headaches, your medications not only stop relieving pain, they actually begin to cause headaches. Doctors treat rebound headache by tapering the medication that is being overused. Sometimes your caregiver will gradually substitute a different type of treatment or medication. Stopping may be a challenge. Regularly overusing a medication increases the potential for serious side effects. Consult a caregiver if you regularly use headache medications more than 2 days per week or more than the label advises. ADDITIONAL QUESTIONS AND ANSWERS Q: What is biofeedback? A: Biofeedback is a self-help treatment. Biofeedback uses special equipment to monitor your body's involuntary physical responses. Biofeedback monitors:  Breathing.  Pulse.  Heart rate.  Temperature.  Muscle tension.  Brain activity. Biofeedback helps you refine and perfect your relaxation exercises. You learn to control the physical responses that are related to stress. Once the technique has been mastered, you do not need the equipment any more. Q: Are headaches hereditary? A: Four out of five (80%) of people that suffer report a family history of migraine. Scientists are not sure if this is genetic or a family predisposition. Despite the uncertainty, a child has a 50% chance of having migraine if one parent suffers. The child has a 75% chance if both parents suffer.  Q: Can children get headaches? A: By the time they reach high school, most young people have experienced some type of headache. Many safe and effective approaches or medications can prevent a headache from occurring or stop it after it has begun.  Q: What type of doctor should I see to diagnose and treat my headache? A: Start with your primary caregiver. Discuss his or her experience and approach to headaches. Discuss methods of classification, diagnosis,  and treatment. Your caregiver may decide to recommend you to a headache specialist, depending upon your symptoms or other physical conditions. Having diabetes, allergies, etc., may require a more comprehensive and inclusive approach to your headache. The National Head[employer] Foundation will provide, upon request, a list of [facility] physician members in your state. Document Released: 03/20/2003 Document Revised: 03/22/2011 Document Reviewed: 08/28/2007 [facility] Patient Information 2014 [place], [place].

## 2013-05-24 NOTE — ED Provider Notes (Signed)
CSN: [id number]     Arrival date & time 05/24/13  0126 History   First MD Initiated Contact with Patient 05/24/13 0356     Chief Complaint  Patient presents with  . Headache  . Numbness     (Consider location/radiation/quality/duration/timing/severity/associated sxs/prior Treatment) HPI 28 year old male presents to emergency department from home with complaint of headache.  Patient reports around midnight he had a popping sensation to the back of his head followed by headache and central facial numbness.  No weakness, no facial droop, no peripheral numbness or weakness.  Patient reports he has felt "off".  Headache is dull starts at the back of his head wraps around.  Numbness to the central portion of his face lasted 2 hours and has since resolved.  No prior history of same.  Patient denies any trauma to his neck.  Patient thinks he may of been stretching when he had the popping sensation, but denies hyperextension or flexion of the neck.  No recent chiropractic manipulation, or other similar treatment.  No prior history of same. History reviewed. No pertinent past medical history. History reviewed. No pertinent past surgical history. Family History  Problem Relation Age of Onset  . Hyperlipidemia Father   . Hypertension Father    History  Substance Use Topics  . Smoking status: Never Smoker   . Smokeless tobacco: Not on file  . Alcohol Use: Yes    Review of Systems  See History of Present Illness; otherwise all other systems are reviewed and negative   Allergies  Review of patient's allergies indicates no known allergies.  Home Medications   Prior to Admission medications   Medication Sig Start Date End Date Taking? Authorizing Provider  ibuprofen (ADVIL,MOTRIN) 200 MG tablet Take 400 mg by mouth every 4 (four) hours as needed for mild pain.   Yes Historical Provider, MD  Liniments (DEEP BLUE RELIEF EX) Apply 1 application topically 2 (two) times daily as needed (pain).   Yes  Historical Provider, MD   BP 138/61  Pulse 73  Temp(Src) 98.2 F (36.8 C)  Resp 18  SpO2 100% Physical Exam  Nursing note and vitals reviewed. Constitutional: He is oriented to person, place, and time. He appears well-developed and well-nourished.  HENT:  Head: Normocephalic and atraumatic.  Right Ear: External ear normal.  Left Ear: External ear normal.  Nose: Nose normal.  Mouth/Throat: Oropharynx is clear and moist.  Patient has mild tenderness to left occipital ridge  Eyes: Conjunctivae and EOM are normal. Pupils are equal, round, and reactive to light.  Neck: Normal range of motion. Neck supple. No JVD present. No tracheal deviation present. No thyromegaly present.  Cardiovascular: Normal rate, regular rhythm, normal heart sounds and intact distal pulses.  Exam reveals no gallop and no friction rub.   No murmur heard. Pulmonary/Chest: Effort normal and breath sounds normal. No stridor. No respiratory distress. He has no wheezes. He has no rales. He exhibits no tenderness.  Abdominal: Soft. Bowel sounds are normal. He exhibits no distension and no mass. There is no tenderness. There is no rebound and no guarding.  Musculoskeletal: Normal range of motion. He exhibits no edema and no tenderness.  Lymphadenopathy:    He has no cervical adenopathy.  Neurological: He is alert and oriented to person, place, and time. He has normal reflexes. No cranial nerve deficit. He exhibits normal muscle tone. Coordination normal.  Skin: Skin is warm and dry. No rash noted. No erythema. No pallor.  Psychiatric: He has a  normal mood and affect. His behavior is normal. Judgment and thought content normal.    ED Course  Procedures (including critical care time) Labs Review Labs Reviewed - No data to display  Imaging Review Ct Head Wo Contrast  05/24/2013   CLINICAL DATA:  With acute headache. Posterior head pain. Facial numbness.  EXAM: CT HEAD WITHOUT CONTRAST  TECHNIQUE: Contiguous axial  images were obtained from the base of the skull through the vertex without intravenous contrast.  COMPARISON:  None.  FINDINGS: Ventricles and sulci appear symmetrical. No mass effect or midline shift. No abnormal extra-axial fluid collections. Gray-white matter junctions are distinct. Basal cisterns are not effaced. No evidence of acute intracranial hemorrhage. No depressed skull fractures. Visualized paranasal sinuses and mastoid air cells are not opacified.  IMPRESSION: No acute intracranial abnormalities.   Electronically Signed   By: Burman NievesWilliam  Stevens M.D.   On: 05/24/2013 05:25     EKG Interpretation None      MDM   Final diagnoses:  Headache   28 year old male with acute onset of severe headache with some atypical neuro symptoms.  Plan for head CT given atypical nature of headache and neuro symptoms.  Patient is within 6 hour window and symptoms seem less likely to be subarachnoid hemorrhage.  If CT scan negative, we'll treat with Toradol and followup with PCP.     Olivia Mackielga M Anayia Eugene, MD 05/24/13 [phone]

## 2013-07-24 ENCOUNTER — Other Ambulatory Visit (HOSPITAL_COMMUNITY): Payer: Self-pay | Admitting: Orthopedic Surgery

## 2013-07-24 DIAGNOSIS — M542 Cervicalgia: Secondary | ICD-10-CM

## 2013-09-10 ENCOUNTER — Ambulatory Visit (HOSPITAL_COMMUNITY)
Admission: RE | Admit: 2013-09-10 | Discharge: 2013-09-10 | Disposition: A | Discharge status: Home / Self Care | Payer: 59 | Source: Ambulatory Visit | Attending: Orthopedic Surgery | Admitting: Orthopedic Surgery

## 2013-09-10 DIAGNOSIS — M542 Cervicalgia: Secondary | ICD-10-CM | POA: Insufficient documentation

## 2013-10-15 ENCOUNTER — Encounter: Payer: Self-pay | Admitting: Podiatry

## 2013-10-15 ENCOUNTER — Ambulatory Visit (INDEPENDENT_AMBULATORY_CARE_PROVIDER_SITE_OTHER): Payer: 59 | Admitting: Podiatry

## 2013-10-15 VITALS — BP 130/75 | HR 63 | Resp 16

## 2013-10-15 DIAGNOSIS — M779 Enthesopathy, unspecified: Secondary | ICD-10-CM

## 2013-10-16 NOTE — Progress Notes (Signed)
Subjective:     Patient ID: Alvin QualiaJames C Johnson, male   DOB: [birth year], 28 y.o.   MRN: [id number]  HPI patient presents stating I'm not sure about my orthotics anymore cushion   Review of Systems     Objective:   Physical Exam Neurovascular status intact no health history changes    Assessment:     Wants more cushion on orthotics    Plan:     Send orthotics back with instructions on padding and cushioning

## 2013-11-02 ENCOUNTER — Other Ambulatory Visit: Payer: Self-pay | Admitting: Family Medicine

## 2013-11-02 ENCOUNTER — Telehealth: Payer: Self-pay

## 2013-11-02 ENCOUNTER — Ambulatory Visit
Admission: RE | Admit: 2013-11-02 | Discharge: 2013-11-02 | Disposition: A | Discharge status: Home / Self Care | Payer: 59 | Source: Ambulatory Visit | Attending: Family Medicine | Admitting: Family Medicine

## 2013-11-02 DIAGNOSIS — J209 Acute bronchitis, unspecified: Secondary | ICD-10-CM

## 2013-11-02 NOTE — Telephone Encounter (Signed)
-  Rocephin 1 gram IM  -Levaquin 500 mg 1 po daily #10 no RF  Pt notified.

## 2013-11-02 NOTE — Telephone Encounter (Signed)
Patient called for the name of the antibiotic he was given when he had walking pneumonia in January.   He plans on going to the doctor around 12:45 today and would like this information.   [phone]

## 2013-11-09 ENCOUNTER — Ambulatory Visit: Payer: 59

## 2013-11-09 DIAGNOSIS — M779 Enthesopathy, unspecified: Secondary | ICD-10-CM

## 2013-11-09 NOTE — Progress Notes (Signed)
Pt is here to pick up refurbished orthotics

## 2013-11-22 ENCOUNTER — Other Ambulatory Visit (HOSPITAL_COMMUNITY): Payer: Self-pay | Admitting: Family Medicine

## 2013-11-22 DIAGNOSIS — J329 Chronic sinusitis, unspecified: Secondary | ICD-10-CM

## 2013-11-27 ENCOUNTER — Encounter (HOSPITAL_COMMUNITY): Payer: Self-pay

## 2013-11-27 ENCOUNTER — Ambulatory Visit (HOSPITAL_COMMUNITY)
Admission: RE | Admit: 2013-11-27 | Discharge: 2013-11-27 | Disposition: A | Discharge status: Home / Self Care | Payer: 59 | Source: Ambulatory Visit | Attending: Family Medicine | Admitting: Family Medicine

## 2013-11-27 DIAGNOSIS — J329 Chronic sinusitis, unspecified: Secondary | ICD-10-CM | POA: Insufficient documentation

## 2013-11-28 ENCOUNTER — Ambulatory Visit (INDEPENDENT_AMBULATORY_CARE_PROVIDER_SITE_OTHER): Payer: 59

## 2013-11-28 ENCOUNTER — Ambulatory Visit (INDEPENDENT_AMBULATORY_CARE_PROVIDER_SITE_OTHER): Payer: 59 | Admitting: Podiatry

## 2013-11-28 ENCOUNTER — Encounter: Payer: Self-pay | Admitting: Podiatry

## 2013-11-28 VITALS — BP 119/72 | HR 68 | Resp 12

## 2013-11-28 DIAGNOSIS — M779 Enthesopathy, unspecified: Secondary | ICD-10-CM

## 2013-11-28 DIAGNOSIS — R52 Pain, unspecified: Secondary | ICD-10-CM

## 2013-11-28 DIAGNOSIS — L84 Corns and callosities: Secondary | ICD-10-CM

## 2013-11-28 MED ORDER — TRIAMCINOLONE ACETONIDE 10 MG/ML IJ SUSP
10.0000 mg | Freq: Once | INTRAMUSCULAR | Status: AC
  Administered 2013-11-28: 10 mg

## 2013-11-28 NOTE — Progress Notes (Signed)
   Subjective:    Patient ID: Alvin Johnson, male    DOB: 23-Oct-1985, 28 y.o.   MRN: [id number]  HPI PT STATED LT FOOT 1,2,3RD TOE HAVE CALLUS AND LT FOOT MEDIAL SIDE ARE SORE/BURNING SENSATION FOR 3 WEEKS. THE TOES ARE GETTING WORSE, FOOT IS BEEN THE SAME NOT WORSE. TRIED NO TREATMENT.   Review of Systems  All other systems reviewed and are negative.      Objective:   Physical Exam        Assessment & Plan:

## 2013-11-29 NOTE — Progress Notes (Signed)
Subjective:     Patient ID: Edward QualiaJames C Lordi, male   DOB: [birth year], 28 y.o.   MRN: [id number]  HPI patient presents stating I'm having a lot of pain between the second and third toes on my left foot and a structural bunion deformity. States that the orthotics have helped but not in between the toes   Review of Systems     Objective:   Physical Exam Neurovascular status intact with no change in health history noted and found to have inflammatory keratotic lesions between the second and third toes left the big toe is pushing over against the second toe and moderate structural bunion deformity is noted    Assessment:     Inflammatory capsulitis between the second and third toes left with significant keratotic lesion big toe    Plan:     Reviewed condition and did careful steroid injections 1.5 mg dexamethasone Kenalog and 3 mg Xylocaine into each digit and then debrided lesions padded and see back when symptoms were to reoccur. Explained that ultimately this may require surgery

## 2013-12-26 ENCOUNTER — Ambulatory Visit (INDEPENDENT_AMBULATORY_CARE_PROVIDER_SITE_OTHER): Payer: 59 | Admitting: Family Medicine

## 2013-12-26 VITALS — BP 126/80 | HR 67 | Temp 98.1°F | Resp 16 | Ht 68.75 in | Wt 168.8 lb

## 2013-12-26 DIAGNOSIS — R0789 Other chest pain: Secondary | ICD-10-CM

## 2013-12-26 DIAGNOSIS — K219 Gastro-esophageal reflux disease without esophagitis: Secondary | ICD-10-CM

## 2013-12-26 MED ORDER — RANITIDINE HCL 150 MG PO TABS: 150.0000 mg | ORAL_TABLET | Freq: Two times a day (BID) | ORAL | Status: DC

## 2013-12-26 NOTE — Progress Notes (Signed)
    MRN: [id number] DOB: Jun 24, 1985  Subjective:   Alvin Johnson is a 28 y.o. male presenting for 2 month history chest pain, shob. Chest pain is mid-sternal, states that it fluctuates between being dull, sharp, pulsating, sometimes radiates to right side or back, sometimes associated with shortness of breath. 1x chest pain was aggravated by doing an ab work-out but otherwise no obvious aggravating factors. Associated symptoms include burping intermittently. Has taken 2 rounds of antibiotics for same complaint plus throat, sinus pain, took Amox and Azi, in 09-10/2013, with some relief of sinus/respiratory infection, chest symptoms have persisted however. He has also tried using Tums intermittently with some relief. Denies heart racing, palpitations, diaphoresis, weakness, syncope, radiation of pain into forearm, neck or jaw, fevers, n/v, diarrhea, abdominal pain, weight loss. Of note, patient denies history of GERD, eats deli-turkey sandwiches often, has pizza or fast food occasionally, however ate really unhealthily while on a cruise ~1 month ago which made his chest pain worse. Patient drinks plenty of water and exercises regularly, uses pre-workout supplement which has ~150mg  caffeine but no creatine. Denies smoking, rare alcohol drink, 1-2 beers a month. Denies any other aggravating or relieving factors, no other questions or concerns.  Alvin Johnson has a current medication list which includes the following prescription(s): vitamin d, mens multivitamin plus, and vitamin c.   He has No Known Allergies.  Alvin Johnson  has no past medical history on file. Also  has no past surgical history on file.  ROS As in subjective.  Objective:   Vitals: BP 126/80 mmHg  Pulse 67  Temp(Src) 98.1 F (36.7 C) (Oral)  Resp 16  Ht 5' 8.75" (1.746 m)  Wt 168 lb 12.8 oz (76.567 kg)  BMI 25.12 kg/m2  SpO2 100%  Physical Exam  Constitutional: He is oriented to person, place, and time and well-developed, well-nourished,  and in no distress.  Cardiovascular: Normal rate, regular rhythm, normal heart sounds and intact distal pulses.  Exam reveals no gallop and no friction rub.   No murmur heard. Pulmonary/Chest: Effort normal and breath sounds normal. No stridor. No respiratory distress. He has no wheezes. He has no rales. He exhibits no tenderness.  Abdominal: Soft. Bowel sounds are normal. He exhibits no distension and no mass. There is no tenderness. There is no rebound and no guarding.  Musculoskeletal: Normal range of motion. He exhibits no edema or tenderness.  Lymphadenopathy:    He has no cervical adenopathy.  Neurological: He is alert and oriented to person, place, and time.  Skin: Skin is warm and dry. No rash noted. He is not diaphoretic. No erythema.  Psychiatric: Mood and affect normal.   Assessment and Plan :   1. Atypical chest pain 2. Gastroesophageal reflux disease, esophagitis presence not specified - CBC, Comprehensive metabolic panel pending - atypical chest pain is likely GERD, vitals reassuring for r/o infectious process, unlikely to cardiac source in an otherwise young, healthy male - will start trial of ranitidine (ZANTAC) for 2-4 weeks, if no improvement will switch to PPI, consider urease breath test for H. Pylori   Wallis BambergMario Farrah Skoda, PA-C [facility] [phone] 12/26/2013 7:49 PM    Reviewed with Mr.Alvin Johnson, agree with plan Elvina SidleKurt Johnson, M.D.

## 2013-12-26 NOTE — Patient Instructions (Addendum)
If your chest pain has not improved with 2-4 weeks of Ranitidine, please return to our clinic for re-evaluation.   Food Choices for Gastroesophageal Reflux Disease When you have gastroesophageal reflux disease (GERD), the foods you eat and your eating habits are very important. Choosing the right foods can help ease the discomfort of GERD. WHAT GENERAL GUIDELINES DO I NEED TO FOLLOW?  Choose fruits, vegetables, whole grains, low-fat dairy products, and low-fat meat, fish, and poultry.  Limit fats such as oils, salad dressings, butter, nuts, and avocado.  Keep a food diary to identify foods that cause symptoms.  Avoid foods that cause reflux. These may be different for different people.  Eat frequent small meals instead of three large meals each day.  Eat your meals slowly, in a relaxed setting.  Limit fried foods.  Cook foods using methods other than frying.  Avoid drinking alcohol.  Avoid drinking large amounts of liquids with your meals.  Avoid bending over or lying down until 2-3 hours after eating. WHAT FOODS ARE NOT RECOMMENDED? The following are some foods and drinks that may worsen your symptoms: Vegetables Tomatoes. Tomato juice. Tomato and spaghetti sauce. Chili peppers. Onion and garlic. Horseradish. Fruits Oranges, grapefruit, and lemon (fruit and juice). Meats High-fat meats, fish, and poultry. This includes hot dogs, ribs, ham, sausage, salami, and bacon. Dairy Whole milk and chocolate milk. Sour cream. Cream. Butter. Ice cream. Cream cheese.  Beverages Coffee and tea, with or without caffeine. Carbonated beverages or energy drinks. Condiments Hot sauce. Barbecue sauce.  Sweets/Desserts Chocolate and cocoa. Donuts. Peppermint and spearmint. Fats and Oils High-fat foods, including [place] fries and potato chips. Other Vinegar. Strong spices, such as black pepper, white pepper, red pepper, cayenne, curry powder, cloves, ginger, and chili powder. The items  listed above may not be a complete list of foods and beverages to avoid. Contact your dietitian for more information. Document Released: 12/28/2004 Document Revised: 01/02/2013 Document Reviewed: 11/01/2012 [facility] Patient Information 2015 [employer] [place]. This information is not intended to replace advice given to you by your health care provider. Make sure you discuss any questions you have with your health care provider.

## 2013-12-27 ENCOUNTER — Encounter: Payer: Self-pay | Admitting: Urgent Care

## 2013-12-27 LAB — COMPREHENSIVE METABOLIC PANEL
ALBUMIN: 4.7 g/dL (ref 3.5–5.2)
ALT: 24 U/L (ref 0–53)
AST: 17 U/L (ref 0–37)
Alkaline Phosphatase: 66 U/L (ref 39–117)
BUN: 16 mg/dL (ref 6–23)
CALCIUM: 9.9 mg/dL (ref 8.4–10.5)
CHLORIDE: 101 meq/L (ref 96–112)
CO2: 25 meq/L (ref 19–32)
Creat: 1.01 mg/dL (ref 0.50–1.35)
GLUCOSE: 85 mg/dL (ref 70–99)
POTASSIUM: 4.3 meq/L (ref 3.5–5.3)
Sodium: 137 mEq/L (ref 135–145)
Total Bilirubin: 0.6 mg/dL (ref 0.2–1.2)
Total Protein: 7.9 g/dL (ref 6.0–8.3)

## 2013-12-27 LAB — CBC
HEMATOCRIT: 44.7 % (ref 39.0–52.0)
HEMOGLOBIN: 15.7 g/dL (ref 13.0–17.0)
MCH: 29.2 pg (ref 26.0–34.0)
MCHC: 35.1 g/dL (ref 30.0–36.0)
MCV: 83.2 fL (ref 78.0–100.0)
MPV: 10 fL (ref 9.4–12.4)
PLATELETS: 328 10*3/uL (ref 150–400)
RBC: 5.37 MIL/uL (ref 4.22–5.81)
RDW: 12.8 % (ref 11.5–15.5)
WBC: 10.3 10*3/uL (ref 4.0–10.5)

## 2014-02-20 DIAGNOSIS — R52 Pain, unspecified: Secondary | ICD-10-CM

## 2014-04-16 ENCOUNTER — Ambulatory Visit: Payer: Self-pay

## 2014-04-16 ENCOUNTER — Other Ambulatory Visit: Payer: Self-pay | Admitting: Occupational Medicine

## 2014-04-16 DIAGNOSIS — M25562 Pain in left knee: Secondary | ICD-10-CM

## 2015-01-23 MED FILL — diazePAM 10 MG TABS: 10 | 1 days supply | Qty: 2 | Fill #0

## 2015-01-24 ENCOUNTER — Encounter (INDEPENDENT_AMBULATORY_CARE_PROVIDER_SITE_OTHER): Payer: 59 | Admitting: Urology

## 2015-01-24 DIAGNOSIS — Z302 Encounter for sterilization: Secondary | ICD-10-CM

## 2015-03-20 DIAGNOSIS — M25511 Pain in right shoulder: Secondary | ICD-10-CM | POA: Diagnosis not present

## 2015-03-24 MED FILL — FLUTICASONE PROP 50 MCG SPR: 50 | 30 days supply | Qty: 16 | Fill #0

## 2015-04-21 DIAGNOSIS — H6123 Impacted cerumen, bilateral: Secondary | ICD-10-CM | POA: Diagnosis not present

## 2015-06-16 DIAGNOSIS — N451 Epididymitis: Secondary | ICD-10-CM | POA: Diagnosis not present

## 2015-06-27 DIAGNOSIS — N451 Epididymitis: Secondary | ICD-10-CM | POA: Diagnosis not present

## 2015-06-27 DIAGNOSIS — R300 Dysuria: Secondary | ICD-10-CM | POA: Diagnosis not present

## 2015-07-25 DIAGNOSIS — N50811 Right testicular pain: Secondary | ICD-10-CM | POA: Diagnosis not present

## 2015-07-25 MED FILL — MELOXICAM 7.5 MG TABLET: 7.5 | 30 days supply | Qty: 60 | Fill #0

## 2015-08-11 DIAGNOSIS — M25522 Pain in left elbow: Secondary | ICD-10-CM | POA: Diagnosis not present

## 2015-08-18 DIAGNOSIS — Z13220 Encounter for screening for lipoid disorders: Secondary | ICD-10-CM | POA: Diagnosis not present

## 2015-08-18 DIAGNOSIS — Z0000 Encounter for general adult medical examination without abnormal findings: Secondary | ICD-10-CM | POA: Diagnosis not present

## 2015-09-09 DIAGNOSIS — D72829 Elevated white blood cell count, unspecified: Secondary | ICD-10-CM | POA: Diagnosis not present

## 2015-09-09 DIAGNOSIS — R1013 Epigastric pain: Secondary | ICD-10-CM | POA: Diagnosis not present

## 2015-09-09 MED FILL — OMEPRAZOLE DR 20 MG CAPSULE: 20 | 30 days supply | Qty: 30 | Fill #0

## 2015-09-23 DIAGNOSIS — D72829 Elevated white blood cell count, unspecified: Secondary | ICD-10-CM | POA: Diagnosis not present

## 2015-10-15 ENCOUNTER — Telehealth: Payer: Self-pay

## 2015-10-15 NOTE — Telephone Encounter (Signed)
Patient last seen 2015, would need an ov for rx refill or use otc zantac. Tried to call patient/no answer.

## 2015-10-15 NOTE — Telephone Encounter (Signed)
Pt was wondering if he could have a refill on his ranitidine (ZANTAC) 150 MG tablet [[id number]][[id number]]. [facility] Please advise at [phone]

## 2015-10-21 NOTE — Telephone Encounter (Signed)
No answ/no VM. Sent not able to reach letter w/info below.

## 2015-11-19 ENCOUNTER — Other Ambulatory Visit: Payer: Self-pay | Admitting: Family Medicine

## 2015-11-19 ENCOUNTER — Ambulatory Visit
Admission: RE | Admit: 2015-11-19 | Discharge: 2015-11-19 | Disposition: A | Discharge status: Home / Self Care | Payer: 59 | Source: Ambulatory Visit | Attending: Family Medicine | Admitting: Family Medicine

## 2015-11-19 DIAGNOSIS — M25571 Pain in right ankle and joints of right foot: Secondary | ICD-10-CM | POA: Diagnosis not present

## 2015-11-19 DIAGNOSIS — S99911A Unspecified injury of right ankle, initial encounter: Secondary | ICD-10-CM | POA: Diagnosis not present

## 2015-11-19 MED FILL — NAPROXEN 500 MG TABLET: 500 | 30 days supply | Qty: 60 | Fill #0

## 2016-03-05 ENCOUNTER — Encounter (HOSPITAL_COMMUNITY): Payer: Self-pay | Admitting: Emergency Medicine

## 2016-03-05 ENCOUNTER — Ambulatory Visit (HOSPITAL_COMMUNITY)
Admission: EM | Admit: 2016-03-05 | Discharge: 2016-03-05 | Disposition: A | Discharge status: Home / Self Care | Payer: 59 | Attending: Family Medicine | Admitting: Family Medicine

## 2016-03-05 DIAGNOSIS — K2100 Gastro-esophageal reflux disease with esophagitis, without bleeding: Secondary | ICD-10-CM

## 2016-03-05 DIAGNOSIS — R079 Chest pain, unspecified: Secondary | ICD-10-CM

## 2016-03-05 MED ORDER — PANTOPRAZOLE SODIUM 20 MG PO TBEC: 20.0000 mg | DELAYED_RELEASE_TABLET | Freq: Every day | ORAL | 0 refills | Status: DC

## 2016-03-05 MED ORDER — GI COCKTAIL ~~LOC~~
30.0000 mL | Freq: Once | ORAL | Status: AC
  Administered 2016-03-05: 30 mL via ORAL

## 2016-03-05 MED ORDER — GI COCKTAIL ~~LOC~~
ORAL | Status: AC
  Filled 2016-03-05: qty 30

## 2016-03-05 MED FILL — PANTOPRAZOLE SOD DR 20 MG T: 20 | 30 days supply | Qty: 30 | Fill #0

## 2016-03-05 NOTE — Discharge Instructions (Signed)
See your Physician for recheck in 2-3 weeks.  Return if symptoms worsen or change.

## 2016-03-05 NOTE — ED Provider Notes (Signed)
CSN: [id number]     Arrival date & time 03/05/16  1450 History   First MD Initiated Contact with Patient 03/05/16 1606     Chief Complaint  Patient presents with  . Chest Pain    left  . Back Pain   (Consider location/radiation/quality/duration/timing/severity/associated sxs/prior Treatment) The history is provided by the patient. No language interpreter was used.  Chest Pain  Pain location:  L chest Pain quality: aching   Pain radiates to:  Does not radiate Pain severity:  Moderate Onset quality:  Gradual Duration:  1 week Timing:  Constant Progression:  Worsening Chronicity:  New Context: movement   Relieved by:  Nothing Worsened by:  Nothing Ineffective treatments:  None tried Associated symptoms: back pain   Risk factors: no diabetes mellitus, no high cholesterol and no hypertension   Back Pain  Associated symptoms: chest pain   Pt reports frequent reflux  History reviewed. No pertinent past medical history. History reviewed. No pertinent surgical history. Family History  Problem Relation Age of Onset  . Hyperlipidemia Father   . Hypertension Father   . CAD Father   . CAD Paternal Grandfather    Social History  Substance Use Topics  . Smoking status: Never Smoker  . Smokeless tobacco: Never Used  . Alcohol use 0.0 oz/week    Review of Systems  Cardiovascular: Positive for chest pain.  Musculoskeletal: Positive for back pain.  All other systems reviewed and are negative.   Allergies  Patient has no known allergies.  Home Medications   Prior to Admission medications   Medication Sig Start Date End Date Taking? Authorizing Provider  Cholecalciferol (VITAMIN D) 2000 UNITS CAPS Take 1 capsule by mouth daily.   Yes Historical Provider, MD  Multiple Vitamins-Minerals (MENS MULTIVITAMIN PLUS) TABS Take 1 tablet by mouth daily.   Yes Historical Provider, MD  vitamin C (ASCORBIC ACID) 500 MG tablet Take 500 mg by mouth daily.   Yes Historical Provider, MD   pantoprazole (PROTONIX) 20 MG tablet Take 1 tablet (20 mg total) by mouth daily. 03/05/16   Elson AreasLeslie K William Laske, PA-C  ranitidine (ZANTAC) 150 MG tablet Take 1 tablet (150 mg total) by mouth 2 (two) times daily. 12/26/13   Wallis BambergMario Mani, PA-C   Meds Ordered and Administered this Visit   Medications  gi cocktail (Maalox,Lidocaine,Donnatal) (30 mLs Oral Given 03/05/16 1633)    BP (!) 110/51 (BP Location: Left Arm)   Pulse 69   Temp 98.2 F (36.8 C) (Oral)   SpO2 100%  No data found.   Physical Exam  Constitutional: He appears well-developed and well-nourished.  HENT:  Head: Normocephalic and atraumatic.  Eyes: Conjunctivae are normal.  Neck: Neck supple.  Cardiovascular: Normal rate and regular rhythm.   No murmur heard. Pulmonary/Chest: Effort normal and breath sounds normal. No respiratory distress.  Abdominal: Soft. There is no tenderness.  Musculoskeletal: He exhibits no edema.  Neurological: He is alert.  Skin: Skin is warm and dry.  Psychiatric: He has a normal mood and affect.  Nursing note and vitals reviewed.   Urgent Care Course     Procedures (including critical care time)  Labs Review Labs Reviewed - No data to display  Imaging Review No results found.   Visual Acuity Review  Right Eye Distance:   Left Eye Distance:   Bilateral Distance:    Right Eye Near:   Left Eye Near:    Bilateral Near:      EKG is normal.  No st changes/  MDM   1. Reflux esophagitis    Scheduled Meds: Continuous Infusions: PRN Meds:. An After Visit Summary was printed and given to the patient.  Follow up with Dr. Tiburcio Pea for evaluation in 1-2 weeks   Elson Areas, PA-C 03/05/16 1717

## 2016-03-05 NOTE — ED Triage Notes (Addendum)
Pt states he has been having left sided burning pain in his chest that radiates to his left shoulder blade where he has tingling.  Pt reports working out chest exercises on Saturday, but he was not lifting heavy and it is the same exercises he has done before.  Pain is reproducible in the central part of his chest.

## 2016-04-22 DIAGNOSIS — N50811 Right testicular pain: Secondary | ICD-10-CM | POA: Diagnosis not present

## 2016-04-22 MED FILL — METHOCARBAMOL 500 MG TABLET: 500 | 10 days supply | Qty: 30 | Fill #0

## 2016-04-22 MED FILL — MELOXICAM 7.5 MG TABLET: 7.5 | 30 days supply | Qty: 60 | Fill #0

## 2016-09-16 ENCOUNTER — Other Ambulatory Visit (HOSPITAL_COMMUNITY): Payer: Self-pay | Admitting: Neurology

## 2016-09-16 DIAGNOSIS — G935 Compression of brain: Secondary | ICD-10-CM

## 2016-09-23 ENCOUNTER — Ambulatory Visit (HOSPITAL_COMMUNITY): Payer: 59

## 2016-09-23 ENCOUNTER — Ambulatory Visit (HOSPITAL_COMMUNITY)
Admission: RE | Admit: 2016-09-23 | Discharge: 2016-09-23 | Disposition: A | Discharge status: Home / Self Care | Payer: PRIVATE HEALTH INSURANCE | Source: Ambulatory Visit | Attending: Neurology | Admitting: Neurology

## 2016-09-23 DIAGNOSIS — G935 Compression of brain: Secondary | ICD-10-CM | POA: Diagnosis not present

## 2016-09-23 DIAGNOSIS — S0231XG Fracture of orbital floor, right side, subsequent encounter for fracture with delayed healing: Secondary | ICD-10-CM | POA: Insufficient documentation

## 2016-09-23 DIAGNOSIS — X58XXXD Exposure to other specified factors, subsequent encounter: Secondary | ICD-10-CM | POA: Diagnosis not present

## 2016-09-23 MED ORDER — GADOBENATE DIMEGLUMINE 529 MG/ML IV SOLN
15.0000 mL | Freq: Once | INTRAVENOUS | Status: AC | PRN
  Administered 2016-09-23: 15 mL via INTRAVENOUS

## 2016-11-18 ENCOUNTER — Other Ambulatory Visit (HOSPITAL_COMMUNITY): Payer: Self-pay | Admitting: Family Medicine

## 2016-11-18 DIAGNOSIS — N50812 Left testicular pain: Secondary | ICD-10-CM | POA: Diagnosis not present

## 2016-11-18 MED FILL — SULFAMETHOXAZOLE/TMP DS TAB: 800-160 | 10 days supply | Qty: 20 | Fill #0

## 2016-11-23 ENCOUNTER — Ambulatory Visit (HOSPITAL_COMMUNITY)
Admission: RE | Admit: 2016-11-23 | Discharge: 2016-11-23 | Disposition: A | Discharge status: Home / Self Care | Payer: 59 | Source: Ambulatory Visit | Attending: Family Medicine | Admitting: Family Medicine

## 2016-11-23 DIAGNOSIS — N503 Cyst of epididymis: Secondary | ICD-10-CM | POA: Insufficient documentation

## 2016-11-23 DIAGNOSIS — M2548 Effusion, other site: Secondary | ICD-10-CM | POA: Diagnosis not present

## 2016-11-23 DIAGNOSIS — N50812 Left testicular pain: Secondary | ICD-10-CM | POA: Diagnosis not present

## 2016-12-01 DIAGNOSIS — B349 Viral infection, unspecified: Secondary | ICD-10-CM | POA: Diagnosis not present

## 2016-12-01 DIAGNOSIS — R52 Pain, unspecified: Secondary | ICD-10-CM | POA: Diagnosis not present

## 2017-02-14 DIAGNOSIS — H6980 Other specified disorders of Eustachian tube, unspecified ear: Secondary | ICD-10-CM | POA: Diagnosis not present

## 2017-02-14 MED FILL — FLUTICASONE PROP 50 MCG SPR: 50 | 30 days supply | Qty: 16 | Fill #0

## 2017-02-14 MED FILL — BROMIPHENIR-PSEUDOEPHED-DM: 30-2-10 | 5 days supply | Qty: 180 | Fill #0

## 2017-05-27 MED FILL — FLUTICASONE PROP 50 MCG SPR: 50 | 60 days supply | Qty: 16 | Fill #0

## 2017-12-10 ENCOUNTER — Ambulatory Visit (INDEPENDENT_AMBULATORY_CARE_PROVIDER_SITE_OTHER): Payer: No Typology Code available for payment source

## 2017-12-10 ENCOUNTER — Encounter (HOSPITAL_COMMUNITY): Payer: Self-pay

## 2017-12-10 ENCOUNTER — Ambulatory Visit (HOSPITAL_COMMUNITY)
Admission: EM | Admit: 2017-12-10 | Discharge: 2017-12-10 | Disposition: A | Discharge status: Home / Self Care | Payer: No Typology Code available for payment source | Attending: Internal Medicine | Admitting: Internal Medicine

## 2017-12-10 DIAGNOSIS — R102 Pelvic and perineal pain: Secondary | ICD-10-CM | POA: Diagnosis not present

## 2017-12-10 DIAGNOSIS — R1030 Lower abdominal pain, unspecified: Secondary | ICD-10-CM

## 2017-12-10 DIAGNOSIS — K6289 Other specified diseases of anus and rectum: Secondary | ICD-10-CM | POA: Diagnosis not present

## 2017-12-10 DIAGNOSIS — K5900 Constipation, unspecified: Secondary | ICD-10-CM

## 2017-12-10 LAB — OCCULT BLOOD, POC DEVICE: Fecal Occult Bld: NEGATIVE

## 2017-12-10 MED ORDER — POLYETHYLENE GLYCOL 3350 17 GM/SCOOP PO POWD: 17.0000 g | Freq: Once | ORAL | 0 refills | Status: AC

## 2017-12-10 MED ORDER — DOCUSATE SODIUM 100 MG PO CAPS: 100.0000 mg | ORAL_CAPSULE | Freq: Two times a day (BID) | ORAL | 0 refills | Status: DC

## 2017-12-10 NOTE — ED Provider Notes (Signed)
[facility]    CSN: [id number] Arrival date & time: 12/10/17  1001     History   Chief Complaint Chief Complaint  Patient presents with  . Rectal Pain    HPI Alvin Johnson is a 32 y.o. male notes no past medical history presenting today for evaluation of rectal pain.  Patient has had gradually worsening rectal pain over the past 3 weeks.  He denies any history of hemorrhoids, denies straining with bowel movements.  Denies any heavy lifting recently.  Denies blood in the stools.  Pain is occasionally at rest, but mainly with bowel movements.  He does feel over the past couple days he has had increased constipation.  Denies abdominal pain, nausea or vomiting.  Eating does not change in pain.  HPI  History reviewed. No pertinent past medical history.  There are no active problems to display for this patient.   History reviewed. No pertinent surgical history.     Home Medications    Prior to Admission medications   Medication Sig Start Date End Date Taking? Authorizing Provider  Cholecalciferol (VITAMIN D) 2000 UNITS CAPS Take 1 capsule by mouth daily.    [provider]  docusate sodium (COLACE) 100 MG capsule Take 1 capsule (100 mg total) by mouth 2 (two) times daily. 12/10/17   Wieters, Hallie C, PA-C  Multiple Vitamins-Minerals (MENS MULTIVITAMIN PLUS) TABS Take 1 tablet by mouth daily.    [provider]  pantoprazole (PROTONIX) 20 MG tablet Take 1 tablet (20 mg total) by mouth daily. 03/05/16   Elson Areas, PA-C  polyethylene glycol powder (GLYCOLAX/MIRALAX) powder Take 17 g by mouth once for 1 dose. 12/10/17 12/10/17  Wieters, Hallie C, PA-C  ranitidine (ZANTAC) 150 MG tablet Take 1 tablet (150 mg total) by mouth 2 (two) times daily. 12/26/13   Wallis Bamberg, PA-C  vitamin C (ASCORBIC ACID) 500 MG tablet Take 500 mg by mouth daily.    [provider]    Family History Family History  Problem Relation Age of Onset  .  Hyperlipidemia Father   . Hypertension Father   . CAD Father   . CAD Paternal Grandfather     Social History Social History   Tobacco Use  . Smoking status: Never Smoker  . Smokeless tobacco: Never Used  Substance Use Topics  . Alcohol use: Yes    Alcohol/week: 0.0 standard drinks  . Drug use: No     Allergies   Patient has no known allergies.   Review of Systems Review of Systems  Constitutional: Negative for fatigue and fever.  Eyes: Negative for redness, itching and visual disturbance.  Respiratory: Negative for shortness of breath.   Cardiovascular: Negative for chest pain and leg swelling.  Gastrointestinal: Positive for constipation and rectal pain. Negative for abdominal pain, anal bleeding, blood in stool, nausea and vomiting.  Musculoskeletal: Negative for arthralgias and myalgias.  Skin: Negative for color change, rash and wound.  Neurological: Negative for dizziness, syncope, weakness, light-headedness and headaches.     Physical Exam Triage Vital Signs ED Triage Vitals  Enc Vitals Group     BP 12/10/17 1015 139/75     Pulse Rate 12/10/17 1015 66     Resp 12/10/17 1015 20     Temp 12/10/17 1015 97.7 F (36.5 C)     Temp Source 12/10/17 1015 Oral     SpO2 12/10/17 1015 97 %     Weight --      Height --  Head Circumference --      Peak Flow --      Pain Score 12/10/17 1017 4     Pain Loc --      Pain Edu? --      Excl. in GC? --    No data found.  Updated Vital Signs BP 139/75 (BP Location: Left Arm)   Pulse 66   Temp 97.7 F (36.5 C) (Oral)   Resp 20   SpO2 97%   Visual Acuity Right Eye Distance:   Left Eye Distance:   Bilateral Distance:    Right Eye Near:   Left Eye Near:    Bilateral Near:     Physical Exam  Constitutional: He appears well-developed and well-nourished.  HENT:  Head: Normocephalic and atraumatic.  Eyes: Conjunctivae are normal.  Neck: Neck supple.  Cardiovascular: Normal rate and regular rhythm.  No  murmur heard. Pulmonary/Chest: Effort normal and breath sounds normal. No respiratory distress.  Abdominal: Soft. There is no tenderness.  Genitourinary:  Genitourinary Comments: No tenderness to palpation of external rectum, no external hemorrhoids, induration or fluctuance, no masses palpated internally, mild tenderness during exam, large amount of stool at rectum  Musculoskeletal: He exhibits no edema.  Neurological: He is alert.  Skin: Skin is warm and dry.  Psychiatric: He has a normal mood and affect.  Nursing note and vitals reviewed.    UC Treatments / Results  Labs (all labs ordered are listed, but only abnormal results are displayed) Labs Reviewed  OCCULT BLOOD, POC DEVICE    EKG None  Radiology Dg Abd 2 Views  Result Date: 12/10/2017 CLINICAL DATA:  Lower abdominopelvic pain, constipation EXAM: ABDOMEN - 2 VIEW COMPARISON:  None. FINDINGS: Mild to moderate colonic stool burden throughout. Air in the rectum. Negative for obstruction or significant dilatation. No ileus. Negative for free air. No acute osseous finding or abnormal calcification. IMPRESSION: Mild to moderate colonic stool burden without obstruction or ileus. Electronically Signed   By: Judie Petit.  Shick M.D.   On: 12/10/2017 11:06    Procedures Procedures (including critical care time)  Medications Ordered in UC Medications - No data to display  Initial Impression / Assessment and Plan / UC Course  I have reviewed the triage vital signs and the nursing notes.  Pertinent labs & imaging results that were available during my care of the patient were reviewed by me and considered in my medical decision making (see chart for details).    Hemoccult negative, abdominal exam with moderate amount of stool burden.  Will treat for constipation as cause of rectal pain given no sign of hemorrhoids, fissure or abscess.  MiraLAX and docusate.  Recommendations below.  Continue to monitor symptoms,Discussed strict return  precautions. Patient verbalized understanding and is agreeable with plan.  Final Clinical Impressions(s) / UC Diagnoses   Final diagnoses:  Rectal pain     Discharge Instructions     No sign of hemmrroids, abscess, fissure or mass causing pain Moderate amount of stool in colon  Please use Miralax for moderate to severe constipation. Take this once a day for the next 2-3 days. Please also start docusate stool softener, twice a day for at least 1 week. If stools become loose, cut down to once a day for another week. If stools remain loose, cut back to 1 pill every other day for a third week. You can stop docusate thereafter and resume as needed for constipation.  To help reduce constipation and promote bowel health: 1. Drink  at least 64 ounces of water each day 2. Eat plenty of fiber (fruits, vegetables, whole grains, legumes) 3. Be physically active or exercise including walking, jogging, swimming, yoga, etc. 4. For active constipation use a stool softener (docusate) or an osmotic laxative (like Miralax) each day, or as needed.    ED Prescriptions    Medication Sig Dispense Auth. Provider   polyethylene glycol powder (GLYCOLAX/MIRALAX) powder Take 17 g by mouth once for 1 dose. 225 g Wieters, Hallie C, PA-C   docusate sodium (COLACE) 100 MG capsule Take 1 capsule (100 mg total) by mouth 2 (two) times daily. 10 capsule Wieters, Hallie C, PA-C     Controlled Substance Prescriptions [place] Controlled Substance Registry consulted? Not Applicable   Lew DawesWieters, Hallie C, [place]PA-C 12/10/17 1250

## 2017-12-10 NOTE — Discharge Instructions (Signed)
No sign of hemmrroids, abscess, fissure or mass causing pain Moderate amount of stool in colon  Please use Miralax for moderate to severe constipation. Take this once a day for the next 2-3 days. Please also start docusate stool softener, twice a day for at least 1 week. If stools become loose, cut down to once a day for another week. If stools remain loose, cut back to 1 pill every other day for a third week. You can stop docusate thereafter and resume as needed for constipation.  To help reduce constipation and promote bowel health: 1. Drink at least 64 ounces of water each day 2. Eat plenty of fiber (fruits, vegetables, whole grains, legumes) 3. Be physically active or exercise including walking, jogging, swimming, yoga, etc. 4. For active constipation use a stool softener (docusate) or an osmotic laxative (like Miralax) each day, or as needed.

## 2017-12-10 NOTE — ED Triage Notes (Signed)
Pt presents with some rectal pain; pt has no complaints of having difficulty with bowel movements and has no recent strenous lifting.

## 2018-05-31 ENCOUNTER — Ambulatory Visit: Payer: PRIVATE HEALTH INSURANCE | Attending: Family Medicine | Admitting: Physical Therapy

## 2018-05-31 ENCOUNTER — Encounter: Payer: Self-pay | Admitting: Physical Therapy

## 2018-05-31 ENCOUNTER — Other Ambulatory Visit: Payer: Self-pay

## 2018-05-31 DIAGNOSIS — M546 Pain in thoracic spine: Secondary | ICD-10-CM | POA: Diagnosis present

## 2018-05-31 DIAGNOSIS — M25512 Pain in left shoulder: Secondary | ICD-10-CM | POA: Insufficient documentation

## 2018-05-31 NOTE — Therapy (Signed)
[facility] [address] [place], [place], [address] Phone: [phone]   Fax:  [phone]  Physical Therapy Evaluation  Patient Details  Name: Alvin Johnson MRN: [id number] Date of Birth: [birth year] Referring Provider (PT): Lanell Persons MD   Encounter Date: 05/31/2018  PT End of Session - 05/31/18 1334    Visit Number  1    Number of Visits  9    Date for PT Re-Evaluation  07/12/18    Authorization Type  workers comp 9 LV, case manager Marice Potter fax number [phone]    Authorization - Visit Number  1    Authorization - Number of Visits  9    PT Start Time  1200    PT Stop Time  1255    PT Time Calculation (min)  55 min    Activity Tolerance  Patient tolerated treatment well    Behavior During Therapy  [facility] for tasks assessed/performed       History reviewed. No pertinent past medical history.  History reviewed. No pertinent surgical history.  There were no vitals filed for this visit.   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 He immedialty felt a burning and spasm sensation in his left shoulder and upper back but denies hearing or feeling a pop. He was seen by MD at work and is now referred to outpatient PT. He says he says he is now performing his normal work duties without true restrictions but he now asks for help more when transferring larger patients.     Pertinent History  no significant PMH other than previous knee scope, but does say he saw Dr Ranell Patrick in the past for Rt shoulder pain    Limitations  Lifting    How long can you sit comfortably?  not limited    How long can you stand comfortably?  not limited    How long can you walk comfortably?  not limited    Diagnostic tests  none on file in his chart    Patient Stated Goals  be able to work without  pain and return to the gym without pain or weakness    Currently in Pain?  Yes    Pain Score  3     Pain Location  Shoulder   and scapular region on Lt   Pain Orientation  Left    Pain Descriptors / Indicators  Burning;Dull    Pain Onset  More than a month ago    Pain Frequency  Intermittent    Aggravating Factors   lifting, pushing, pulling    Pain Relieving Factors  rest, working his shoulder out some, ice    Effect of Pain on Daily Activities  limits work abilities    Multiple Pain Sites  No         [facility] PT Assessment - 05/31/18 0001      Assessment   Medical Diagnosis  Lt shoulder and scapular-thoracic pain/spasm    Referring Provider (PT)  Lanell Persons MD    Onset Date/Surgical Date  03/17/18   onset of injury   Next MD Visit  unsure    Prior Therapy  PT for his knee after scope in 2016      Precautions   Precautions  None      Restrictions  Weight Bearing Restrictions  No      Balance Screen   Has the patient fallen in the past 6 months  No      Home Environment   Living Environment  Private residence      Prior Function   Level of Independence  Independent    Vocation  Full time employment    Librarian, academic, has to lift,and transfer patients    Leisure  hunt, fish, workout      Cognition   Overall Cognitive Status  Within Functional Limits for tasks assessed      Observation/Other Assessments   Focus on Therapeutic Outcomes (FOTO)   2nd visit due to time constraints      Sensation   Light Touch  Appears Intact      Coordination   Gross Motor Movements are Fluid and Coordinated  Yes      Posture/Postural Control   Posture Comments  WNL      ROM / Strength   AROM / PROM / Strength  AROM;Strength      AROM   Overall AROM Comments  Lt shoulder WNL      Strength   Overall Strength Comments  Lt shoulder WNL and 5/5 MMT, He does have some weakness 4+/5 with Rt shoulder abd and ER      Flexibility   Soft Tissue  Assessment /Muscle Length  --   tight upper trap Lt     Palpation   Palpation comment  TTP in supraspinatus, infraspinatus, upper trap on Lt, has good GH mobiility      Special Tests   Other special tests  neg impingement and labral testing, no painful arc      Ambulation/Gait   Gait Comments  WNL gait                Objective measurements completed on examination: See above findings.      [facility] Adult PT Treatment/Exercise - 05/31/18 0001      Modalities   Modalities  Cryotherapy;Electrical Stimulation      Cryotherapy   Number Minutes Cryotherapy  15 Minutes    Cryotherapy Location  Shoulder    Type of Cryotherapy  Ice pack      Electrical Stimulation   Electrical Stimulation Location  Lt shoulder and scapular region    Electrical Stimulation Action  IFC    Electrical Stimulation Parameters  to tolerance in sitting    Electrical Stimulation Goals  Tone;Pain      Manual Therapy   Manual therapy comments  CT junction, scapular, and throacic mobs/manip but unable to get cavitation             PT Education - 05/31/18 1334    Education Details  HEP, POC, TENS    Person(s) Educated  Patient    Methods  Explanation;Demonstration;Verbal cues;Handout    Comprehension  Verbalized understanding;Need further instruction          PT Long Term Goals - 05/31/18 1447      PT LONG TERM GOAL #1   Title  Pt will be I and compliant with HEP. (6 weeks 07/12/18)    Status  New      PT LONG TERM GOAL #2   Title  Pt will report overall less than 1-2/10 pain with usual work duties. (6 weeks 07/12/18)      PT LONG TERM GOAL #3   Title  Pt will be able to return to his usual strength  training program at gym. (6 weeks 07/12/18)      PT LONG TERM GOAL #4   Title  Pt will be able to lift, push, pull at least 50 lbs repetitively without complaints. 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 His pain is overall low irritabilty but is aggravated by repetiitive lifting at work. He has good overall strenth and ROM but has some incresed muscle tension and spasm in his Lt upper trap and infra/supraspinatus. He may benefit from home TENS unit to reduce pain and spasm and this was trialed with him today. He was given HEP for scapular-thoracic and shoulder stretching, mobility, and stabilization. He will benefit from skilled PT to address his pain and spasm and allow for return to full work duties without limitations or pain.     Personal Factors and Comorbidities  Profession    Examination-Activity Limitations  Lift;Carry;Caring for Others    Examination-Participation Restrictions  --   work and gym   Stability/Clinical Decision Making  Stable/Uncomplicated    Clinical Decision Making  Low    Rehab Potential  Excellent    PT Frequency  Other (comment)   1-2   PT Duration  6 weeks    PT Treatment/Interventions  ADLs/Self Care Home Management;Cryotherapy;Electrical Stimulation;Iontophoresis 4mg /ml Dexamethasone;Moist Heat;Traction;Ultrasound;Therapeutic activities;Therapeutic exercise;Neuromuscular re-education;Patient/family education;Manual techniques;Dry needling;Passive range of motion;Taping;Spinal Manipulations;Joint Manipulations    PT Next Visit Plan  consider DN, modalties, and MT for pain and spasm, needs scapular thoracic mobility, try prone scapular strengthening, progress functional lifting, push, pull as able    PT Home Exercise Plan  post capsule stretch, doorway stretch, UT stretch, T mobility with foam roller, H abd, row, ext, IR, ER with blue Tband, quadriped alt arm raises       Patient will benefit from skilled therapeutic intervention in order to improve the  following deficits and impairments:  Decreased activity tolerance, Decreased endurance, Hypomobility  Visit Diagnosis: Acute pain of left shoulder  Pain in thoracic spine     Problem List There are no active problems to display for this patient.   Birdie RiddleBrian R Nelson,PT,DPT 05/31/2018, 2:54 PM  [facility] [address] [place], [place], [address] Phone: [phone]   Fax:  [phone]  Name: Edward QualiaJames C Amsler MRN: [id number] Date of Birth: 1985/04/19

## 2018-05-31 NOTE — Patient Instructions (Addendum)

## 2018-06-06 ENCOUNTER — Encounter: Payer: No Typology Code available for payment source | Admitting: Physical Therapy

## 2018-06-08 ENCOUNTER — Other Ambulatory Visit: Payer: Self-pay

## 2018-06-08 ENCOUNTER — Encounter: Payer: Self-pay | Admitting: Physical Therapy

## 2018-06-08 ENCOUNTER — Ambulatory Visit: Payer: PRIVATE HEALTH INSURANCE | Admitting: Physical Therapy

## 2018-06-08 DIAGNOSIS — M546 Pain in thoracic spine: Secondary | ICD-10-CM

## 2018-06-08 DIAGNOSIS — M25512 Pain in left shoulder: Secondary | ICD-10-CM

## 2018-06-08 NOTE — Therapy (Signed)
[facility] [address] [place], [place], [address] Phone: [phone]   Fax:  [phone]  Physical Therapy Treatment  Patient Details  Name: Alvin QualiaJames C Johnson MRN: [id number] Date of Birth: 10/25/1985 Referring Provider (PT): Lanell Personshomas Kingsley MD   Encounter Date: 06/08/2018  PT End of Session - 06/08/18 1203    Visit Number  2    Number of Visits  9    Date for PT Re-Evaluation  07/12/18    Authorization Type  workers comp 9 LV, case manager Marice PotterJulie Hughes fax number [phone]    Authorization - Visit Number  2    Authorization - Number of Visits  9    PT Start Time  1130    PT Stop Time  1210    PT Time Calculation (min)  40 min    Activity Tolerance  Patient tolerated treatment well    Behavior During Therapy  [facility] for tasks assessed/performed       History reviewed. No pertinent past medical history.  History reviewed. No pertinent surgical history.  There were no vitals filed for this visit.  Subjective Assessment - 06/08/18 1130    Subjective  Pt. reports feels like "pulled something" in lower back lifting a box over the weekend. Got chiropractic tx. earlier today for this. Shoulder/upper back pain improved the past couple of days. LBP only mild at this point. Discussed dry needling and provided handout-pt. wishes to defer for today and think about it before considering at future tx. session.    Currently in Pain?  Yes    Pain Score  1     Pain Location  Back    Pain Orientation  Lower    Pain Descriptors / Indicators  Aching    Pain Type  Acute pain    Pain Onset  In the past 7 days    Pain Frequency  Intermittent    Aggravating Factors   bending    Pain Relieving Factors  ice, movement                       [facility] Adult PT Treatment/Exercise - 06/08/18 0001      Exercises   Exercises  Shoulder      Shoulder Exercises: Prone   Horizontal ABduction 1  AROM;Left;20 reps    Other Prone  Exercises  prone row 2x10 with 4 lbs., prone ext 2x10 with 2 lb.s      Shoulder Exercises: Standing   External Rotation  Strengthening;Both;20 reps    Theraband Level (Shoulder External Rotation)  Level 3 (Green)      Shoulder Exercises: Stretch   Other Shoulder Stretches  Left posterior shoulder stretch 30 sec x 3      Manual Therapy   Manual Therapy  Soft tissue mobilization    Manual therapy comments  held spinal mobs/manip due to pt. saw chiro earlier today which included thoracic region    Soft tissue mobilization  Left posterolater scapular region, infraspinatus, teres major/minor, posterior and middle deltoid             PT Education - 06/08/18 1202    Education Details  dry needling, POC, etiology myofascial pain/referral pattern    Person(s) Educated  Patient    Methods  Explanation    Comprehension  Verbalized understanding          PT Long Term Goals - 05/31/18 1447      PT LONG TERM GOAL #1  Title  Pt will be I and compliant with HEP. (6 weeks 07/12/18)    Status  New      PT LONG TERM GOAL #2   Title  Pt will report overall less than 1-2/10 pain with usual work duties. (6 weeks 07/12/18)      PT LONG TERM GOAL #3   Title  Pt will be able to return to his usual strength training program at gym. (6 weeks 07/12/18)      PT LONG TERM GOAL #4   Title  Pt will be able to lift, push, pull at least 50 lbs repetitively without complaints. (6 weeks 07/12/18)            Plan - 06/08/18 1204    Clinical Impression Statement  Held dry needling per pt. request today (see subjective) and also held spinal mobs as pt. had adjustment from chiropractor earlier today which included thoracic region. Extensive focus manual therapy with STM to posterior scapular region-suspect myofascial etiology coniributing to current symptoms. Pt. improving from baseline with decreased pain and associated functional gains.    Stability/Clinical Decision Making  Stable/Uncomplicated     Rehab Potential  Excellent    PT Frequency  --   1-2x/week   PT Duration  6 weeks    PT Treatment/Interventions  ADLs/Self Care Home Management;Cryotherapy;Electrical Stimulation;Iontophoresis 4mg /ml Dexamethasone;Moist Heat;Traction;Ultrasound;Therapeutic activities;Therapeutic exercise;Neuromuscular re-education;Patient/family education;Manual techniques;Dry needling;Passive range of motion;Taping;Spinal Manipulations;Joint Manipulations    PT Next Visit Plan  consider DN if pt. agreeable, modalities prn, and MT for pain and spasm, needs scapular thoracic mobility, progress scapular strengthening and add functional lifting, push, pull as able    PT Home Exercise Plan  post capsule stretch, doorway stretch, UT stretch, T mobility with foam roller, H abd, row, ext, IR, ER with blue Tband, quadriped alt arm raises    Consulted and Agree with Plan of Care  Patient       Patient will benefit from skilled therapeutic intervention in order to improve the following deficits and impairments:  Decreased activity tolerance, Decreased endurance, Hypomobility  Visit Diagnosis: Acute pain of left shoulder  Pain in thoracic spine     Problem List There are no active problems to display for this patient.   Lazarus Gowda, PT, DPT 06/08/18 12:11 PM  [facility] [address] [place], [place], [address] Phone: [phone]   Fax:  [phone]  Name: Alvin Johnson MRN: [id number] Date of Birth: 06-13-1985

## 2018-06-09 ENCOUNTER — Encounter: Payer: Self-pay | Admitting: Physical Therapy

## 2018-06-09 ENCOUNTER — Ambulatory Visit: Payer: PRIVATE HEALTH INSURANCE | Admitting: Physical Therapy

## 2018-06-09 DIAGNOSIS — M25512 Pain in left shoulder: Secondary | ICD-10-CM

## 2018-06-09 DIAGNOSIS — M546 Pain in thoracic spine: Secondary | ICD-10-CM

## 2018-06-09 NOTE — Therapy (Signed)
[facility] [address] [place], [place], [address] Phone: [phone]   Fax:  [phone]  Physical Therapy Treatment  Patient Details  Name: Alvin QualiaJames C Johnson MRN: [id number] Date of Birth: 1985/06/17 Referring Provider (PT): Lanell Personshomas Kingsley MD   Encounter Date: 06/09/2018  PT End of Session - 06/09/18 0937    Visit Number  3    Number of Visits  9    Date for PT Re-Evaluation  07/12/18    Authorization Type  workers comp 9 LV, case manager Marice PotterJulie Hughes fax number [phone]    Authorization - Visit Number  3    Authorization - Number of Visits  9    PT Start Time  0920    PT Stop Time  1010    PT Time Calculation (min)  50 min    Activity Tolerance  Patient tolerated treatment well    Behavior During Therapy  [facility]FL for tasks assessed/performed       History reviewed. No pertinent past medical history.  History reviewed. No pertinent surgical history.  There were no vitals filed for this visit.  Subjective Assessment - 06/09/18 0928    Subjective  No pain today.  Back sore from chiropractor.  Did a light chest workout last night without any pain.  Still interested in DN if pain persists nect week.     Currently in Pain?  No/denies    Pain Onset  More than a month ago    Pain Frequency  Intermittent           [facility] Adult PT Treatment/Exercise - 06/09/18 0001      Shoulder Exercises: Supine   Protraction  Strengthening;Left;10 reps    Protraction Weight (lbs)  8    Horizontal ABduction  Strengthening;Both;15 reps    Theraband Level (Shoulder Horizontal ABduction)  Level 4 (Blue)    Diagonals  Strengthening;Both;10 reps    Theraband Level (Shoulder Diagonals)  Level 4 (Blue)      Shoulder Exercises: Standing   External Rotation  Strengthening;Both;10 reps    External Rotation Weight (lbs)  no wgt, arms on wall at 90 deg       Shoulder Exercises: ROM/Strengthening   UBE (Upper Arm Bike)  reverse L4 for 5 min      "W" Arms  x 10     Other ROM/Strengthening Exercises  reverse fly 4 lbs bilateral x 15 in hip hinge       Shoulder Exercises: Stretch   Other Shoulder Stretches  thoracic mobility transverse foam roller x 5 levels with cues and breathing     Other Shoulder Stretches  sleeper stretch x 5 each side, tighter on L but improved with reps       Manual Therapy   Soft tissue mobilization  Left posterolater scapular region, infraspinatus, teres major/minor, posterior and middle deltoid   also levator scap, min pain and discomfort                  PT Long Term Goals - 05/31/18 1447      PT LONG TERM GOAL #1   Title  Pt will be I and compliant with HEP. (6 weeks 07/12/18)    Status  New      PT LONG TERM GOAL #2   Title  Pt will report overall less than 1-2/10 pain with usual work duties. (6 weeks 07/12/18)      PT LONG TERM GOAL #3   Title  Pt will be  able to return to his usual strength training program at gym. (6 weeks 07/12/18)      PT LONG TERM GOAL #4   Title  Pt will be able to lift, push, pull at least 50 lbs repetitively without complaints. (6 weeks 07/12/18)            Plan - 06/09/18 1150    Clinical Impression Statement  Patient felt no pain today prior or during session.  Worked on scapular stab ex in supine on foam roller as he has one at home.  He may consider TrPDN if pain persists into next week.     PT Treatment/Interventions  ADLs/Self Care Home Management;Cryotherapy;Electrical Stimulation;Iontophoresis 4mg /ml Dexamethasone;Moist Heat;Traction;Ultrasound;Therapeutic activities;Therapeutic exercise;Neuromuscular re-education;Patient/family education;Manual techniques;Dry needling;Passive range of motion;Taping;Spinal Manipulations;Joint Manipulations    PT Next Visit Plan  consider DN if pt. agreeable, modalities prn, and MT for pain and spasm, needs scapular thoracic mobility, progress scapular strengthening and add functional lifting, push, pull as able    PT  Home Exercise Plan  post capsule stretch, doorway stretch, UT stretch, T mobility with foam roller, H abd, row, ext, IR, ER with blue Tband, quadriped alt arm raises    Consulted and Agree with Plan of Care  Patient       Patient will benefit from skilled therapeutic intervention in order to improve the following deficits and impairments:  Decreased activity tolerance, Decreased endurance, Hypomobility  Visit Diagnosis: Acute pain of left shoulder  Pain in thoracic spine     Problem List There are no active problems to display for this patient.   Jakyia Gaccione 06/09/2018, 11:55 AM  [facility] [address] [place], [place], [address] Phone: [phone]   Fax:  [phone]  Name: Alvin Johnson MRN: [id number] Date of Birth: 10/29/1985

## 2018-06-13 ENCOUNTER — Ambulatory Visit: Payer: PRIVATE HEALTH INSURANCE | Admitting: Physical Therapy

## 2018-06-13 ENCOUNTER — Encounter

## 2018-06-15 ENCOUNTER — Ambulatory Visit: Payer: PRIVATE HEALTH INSURANCE | Admitting: Physical Therapy

## 2018-06-21 ENCOUNTER — Ambulatory Visit (INDEPENDENT_AMBULATORY_CARE_PROVIDER_SITE_OTHER): Payer: No Typology Code available for payment source

## 2018-06-21 ENCOUNTER — Other Ambulatory Visit: Payer: Self-pay

## 2018-06-21 ENCOUNTER — Encounter (HOSPITAL_COMMUNITY): Payer: Self-pay | Admitting: Emergency Medicine

## 2018-06-21 ENCOUNTER — Ambulatory Visit (HOSPITAL_COMMUNITY)
Admission: EM | Admit: 2018-06-21 | Discharge: 2018-06-21 | Disposition: A | Discharge status: Home / Self Care | Payer: No Typology Code available for payment source | Attending: Family Medicine | Admitting: Family Medicine

## 2018-06-21 DIAGNOSIS — K219 Gastro-esophageal reflux disease without esophagitis: Secondary | ICD-10-CM

## 2018-06-21 DIAGNOSIS — R0789 Other chest pain: Secondary | ICD-10-CM

## 2018-06-21 MED ORDER — ALUM & MAG HYDROXIDE-SIMETH 200-200-20 MG/5ML PO SUSP
30.0000 mL | Freq: Once | ORAL | Status: AC
  Administered 2018-06-21: 30 mL via ORAL

## 2018-06-21 MED ORDER — LIDOCAINE VISCOUS HCL 2 % MT SOLN
15.0000 mL | Freq: Once | OROMUCOSAL | Status: AC
  Administered 2018-06-21: 15 mL via ORAL

## 2018-06-21 MED ORDER — OMEPRAZOLE 20 MG PO CPDR: 20.0000 mg | DELAYED_RELEASE_CAPSULE | Freq: Every day | ORAL | 0 refills | Status: DC

## 2018-06-21 MED ORDER — LIDOCAINE VISCOUS HCL 2 % MT SOLN
OROMUCOSAL | Status: AC
  Filled 2018-06-21: qty 15

## 2018-06-21 MED ORDER — ALUM & MAG HYDROXIDE-SIMETH 200-200-20 MG/5ML PO SUSP
ORAL | Status: AC
  Filled 2018-06-21: qty 30

## 2018-06-21 NOTE — ED Provider Notes (Signed)
[name]    CSN: [id number] Arrival date & time: 06/21/18  1636     History   Chief Complaint Chief Complaint  Patient presents with  . Chest Pain    HPI Alvin Johnson is a 33 y.o. male.   Alvin Johnson presents with complaints of sternal pain. States today while at work, around 83, felt palpitations sensation and "tappign" twitches to his left axilla/pectoralis. Stopped, returned, and then stopped. Currently with sternal chest burning which radiates to posterior chest wall/upper back as well as some soreness to left neck and right jaw soreness. No nausea, no diaphoresis. No cough or shortness of breath . Pain 4/10. Has had similar in the past and gi cocktail helped. Has not had issues since then, only occasionally uses tums. No personal cardiac history. States his dad has had cardiac stenting. Follows with a PCP, had labs done yesterday actually with normal cholesterol. He is not on any medications. Hasn't taken any medications for symptoms. Drank coffee prior to onset of symptoms, although does drink coffee regularly. Has been under increased stress due to work and moved recently.     ROS per HPI, negative if not otherwise mentioned.      History reviewed. No pertinent past medical history.  There are no active problems to display for this patient.   Past Surgical History:  Procedure Laterality Date  . TONSILLECTOMY    . VASECTOMY         Home Medications    Prior to Admission medications   Medication Sig Start Date End Date Taking? Authorizing Provider  calcium carbonate (TUMS EX) 750 MG chewable tablet Chew 1 tablet by mouth daily.   Yes [provider]  Cholecalciferol (VITAMIN D) 2000 UNITS CAPS Take 1 capsule by mouth daily.    [provider]  docusate sodium (COLACE) 100 MG capsule Take 1 capsule (100 mg total) by mouth 2 (two) times daily. 12/10/17   Wieters, Hallie C, PA-C  Multiple Vitamins-Minerals (MENS MULTIVITAMIN  PLUS) TABS Take 1 tablet by mouth daily.    [provider]  omeprazole (PRILOSEC) 20 MG capsule Take 1 capsule (20 mg total) by mouth daily. 06/21/18   Zigmund Gottron, NP  vitamin C (ASCORBIC ACID) 500 MG tablet Take 500 mg by mouth daily.    [provider]    Family History Family History  Problem Relation Age of Onset  . Hyperlipidemia Father   . Hypertension Father   . CAD Father   . CAD Paternal Grandfather     Social History Social History   Tobacco Use  . Smoking status: Never Smoker  . Smokeless tobacco: Never Used  Substance Use Topics  . Alcohol use: Yes    Alcohol/week: 0.0 standard drinks  . Drug use: No     Allergies   Patient has no known allergies.   Review of Systems Review of Systems   Physical Exam Triage Vital Signs ED Triage Vitals  Enc Vitals Group     BP 06/21/18 1700 131/76     Pulse Rate 06/21/18 1700 67     Resp 06/21/18 1700 16     Temp 06/21/18 1700 (!) 97.1 F (36.2 C)     Temp Source 06/21/18 1700 Oral     SpO2 06/21/18 1700 96 %     Weight --      Height --      Head Circumference --      Peak Flow --  Pain Score 06/21/18 1701 3     Pain Loc --      Pain Edu? --      Excl. in GC? --    No data found.  Updated Vital Signs BP 131/76   Pulse 67   Temp (!) 97.1 F (36.2 C) (Oral)   Resp 16   SpO2 96%    Physical Exam Constitutional:      Appearance: He is well-developed.  Cardiovascular:     Rate and Rhythm: Normal rate and regular rhythm.     Pulses:          Radial pulses are 2+ on the right side and 2+ on the left side.  Pulmonary:     Effort: Pulmonary effort is normal.     Breath sounds: Normal breath sounds.  Skin:    General: Skin is warm and dry.  Neurological:     Mental Status: He is alert and oriented to person, place, and time.    EKG:  NSR rate of 66 . Noted isolated questionable t inversion at lead III which appear new. Previous EKG was available for review. Reviewed with  supervising physician dr. Tracie HarrierHagler.   UC Treatments / Results  Labs (all labs ordered are listed, but only abnormal results are displayed) Labs Reviewed - No data to display  EKG None  Radiology Dg Chest 2 View  Result Date: 06/21/2018 CLINICAL DATA:  Left-sided chest pain. EXAM: CHEST - 2 VIEW COMPARISON:  November 02, 2013 FINDINGS: The heart size and mediastinal contours are within normal limits. Both lungs are clear. The visualized skeletal structures are unremarkable. IMPRESSION: No active cardiopulmonary disease. Electronically Signed   By: Gerome Samavid  Williams III M.D   On: 06/21/2018 17:54    Procedures Procedures (including critical care time)  Medications Ordered in UC Medications  alum & mag hydroxide-simeth (MAALOX/MYLANTA) 200-200-20 MG/5ML suspension 30 mL (30 mLs Oral Given 06/21/18 1812)    And  lidocaine (XYLOCAINE) 2 % viscous mouth solution 15 mL (15 mLs Oral Given 06/21/18 1813)    Initial Impression / Assessment and Plan / UC Course  I have reviewed the triage vital signs and the nursing notes.  Pertinent labs & imaging results that were available during my care of the patient were reviewed by me and considered in my medical decision making (see chart for details).    Burning pain improved with gi cocktail here in UC. Chest xray and ekg reassuring. Low risk factors for ACS. Hx of similar with gastritis. Omeprazole initiated with strict return precautions. Patient verbalized understanding and agreeable to plan.    Final Clinical Impressions(s) / UC Diagnoses   Final diagnoses:  Gastroesophageal reflux disease, esophagitis presence not specified     Discharge Instructions     Ekg and chest xray are reassuring here today.  I suspect this is related to GERD/gastritis.  Please restart omeprazole daily.  If any worsening or persistent symptoms- chest pain, palpitations, nausea, sweating, arm weakness, or otherwise worsening please go to the ER.     ED  Prescriptions    Medication Sig Dispense Auth. Provider   omeprazole (PRILOSEC) 20 MG capsule Take 1 capsule (20 mg total) by mouth daily. 30 capsule [name], Chizaram Latino B, NP     Controlled Substance Prescriptions [place] Controlled Substance Registry consulted? Not Applicable   [name], Vennessa Affinito B, NP 06/21/18 1815

## 2018-06-21 NOTE — Discharge Instructions (Signed)
Ekg and chest xray are reassuring here today.  I suspect this is related to GERD/gastritis.  Please restart omeprazole daily.  If any worsening or persistent symptoms- chest pain, palpitations, nausea, sweating, arm weakness, or otherwise worsening please go to the ER.

## 2018-06-21 NOTE — ED Triage Notes (Signed)
Pt presents to [facility] for assessment of episode today of left chest/axilla pain/pounding/twitching, followed by a burning sensation substernally, some belching, and left neck pain.  Denies lightheadedness, dizziness, denies n/v, denies diaphoresis.

## 2018-06-21 NOTE — ED Notes (Signed)
Patient remains in xray 

## 2018-06-27 ENCOUNTER — Other Ambulatory Visit: Payer: Self-pay

## 2018-06-27 ENCOUNTER — Ambulatory Visit: Payer: PRIVATE HEALTH INSURANCE | Attending: Family Medicine | Admitting: Physical Therapy

## 2018-06-27 DIAGNOSIS — M25512 Pain in left shoulder: Secondary | ICD-10-CM | POA: Diagnosis present

## 2018-06-27 DIAGNOSIS — M546 Pain in thoracic spine: Secondary | ICD-10-CM | POA: Insufficient documentation

## 2018-06-27 NOTE — Therapy (Signed)
[facility] [address] [place], [place], [address] Phone: [phone]   Fax:  [phone]  Physical Therapy Treatment  Patient Details  Name: Edward QualiaJames C Leflore MRN: [id number] Date of Birth: Sep 06, 1985 Referring Provider (PT): Lanell Personshomas Kingsley MD   Encounter Date: 06/27/2018  PT End of Session - 06/27/18 1555    Visit Number  4    Number of Visits  9    Date for PT Re-Evaluation  07/12/18    Authorization Type  workers comp 9 LV, case manager Marice PotterJulie Hughes fax number [phone]    Authorization - Visit Number  4    Authorization - Number of Visits  9    PT Start Time  1505    PT Stop Time  1550    PT Time Calculation (min)  45 min    Activity Tolerance  Patient tolerated treatment well    Behavior During Therapy  [facility]trWFL for tasks assessed/performed       No past medical history on file.  Past Surgical History:  Procedure Laterality Date  . TONSILLECTOMY    . VASECTOMY      There were no vitals filed for this visit.  Subjective Assessment - 06/27/18 1524    Subjective  Pt relays his shoulder is doing much better, he relays no pain upon arrival and relays compliance with HEP.    Currently in Pain?  No/denies                       [facility]/Exercise - 06/27/18 0001      Shoulder Exercises: Supine   Protraction  Strengthening;Both;20 reps    Protraction Weight (lbs)  10    Horizontal ABduction  Strengthening;Both;20 reps    Theraband Level (Shoulder Horizontal ABduction)  Level 4 (Blue)    Horizontal ABduction Limitations  on foam roller    External Rotation  Strengthening;Both;20 reps    Theraband Level (Shoulder External Rotation)  Level 4 (Blue)    External Rotation Limitations  on foam roller      Shoulder Exercises: Prone   Other Prone Exercises  Prone Y, T, W, extension 15 reps each over Pball      Shoulder Exercises: Standing   Diagonals Limitations  D1 and D2 with blue band  for Lt shoulder flexion 15 reps each      Shoulder Exercises: ROM/Strengthening   UBE (Upper Arm Bike)  L5 3 min fwd/3 min backward    Lat Pull Limitations  45 lbs 2X15    Cybex Press Limitations  chest press machine 45 lbs 2 sets of 15 one set wide grip, one set narrow grip    Cybex Row Limitations  45 lbs 2 sets of 15 reps, one wider grip, one narrow grip    Ball on Wall  15 reps with yellow weighted ball overall head up/down, lateral, circles      Shoulder Exercises: Stretch   Other Shoulder Stretches  doorway stretch 3 X 30 sec, IR stretch behind back 10 sec x 5, Thoracic extension seated on 1/2 roll 10 sec X 5, Thoracic mobility supine on 1/2 roll for star gazer 5 sec X 10             PT Education - 06/27/18 1554    Education Details  recommendation to progress his weight with strengthening program at home as tolerated but still staying in 10-20 rep range    Person(s) Educated  Patient  Methods  Explanation    Comprehension  Verbalized understanding          PT Long Term Goals - 05/31/18 1447      PT LONG TERM GOAL #1   Title  Pt will be I and compliant with HEP. (6 weeks 07/12/18)    Status  New      PT LONG TERM GOAL #2   Title  Pt will report overall less than 1-2/10 pain with usual work duties. (6 weeks 07/12/18)      PT LONG TERM GOAL #3   Title  Pt will be able to return to his usual strength training program at gym. (6 weeks 07/12/18)      PT LONG TERM GOAL #4   Title  Pt will be able to lift, push, pull at least 50 lbs repetitively without complaints. (6 weeks 07/12/18)            Plan - 06/27/18 1556    Clinical Impression Statement  He is making great progress with PT. He was not having pain today so manual therapy held and instead session focused on progressing his shoulder strength and stabilizaiton program with continued thoracic mobility. He had good tolerance to exercise progression without complaints. PT will continue to progress as tolerated  toward his functional goals.    PT Treatment/Interventions  ADLs/Self Care Home Management;Cryotherapy;Electrical Stimulation;Iontophoresis 4mg /ml Dexamethasone;Moist Heat;Traction;Ultrasound;Therapeutic activities;Therapeutic exercise;Neuromuscular re-education;Patient/family education;Manual techniques;Dry needling;Passive range of motion;Taping;Spinal Manipulations;Joint Manipulations    PT Next Visit Plan  consider DN if pt. agreeable, modalities prn, and MT for pain and spasm, needs scapular thoracic mobility, progress scapular strengthening and add functional lifting, push, pull as able    PT Home Exercise Plan  post capsule stretch, doorway stretch, UT stretch, T mobility with foam roller, H abd, row, ext, IR, ER with blue Tband, quadriped alt arm raises    Consulted and Agree with Plan of Care  Patient       Patient will benefit from skilled therapeutic intervention in order to improve the following deficits and impairments:  Decreased activity tolerance, Decreased endurance, Hypomobility  Visit Diagnosis: 1. Acute pain of left shoulder   2. Pain in thoracic spine        Problem List There are no active problems to display for this patient.   Silvestre Mesi 06/27/2018, 3:58 PM  [facility] [address] [place], [place], [address] Phone: [phone]   Fax:  [phone]  Name: EBONY RICKEL MRN: [id number] Date of Birth: Jun 28, 1985

## 2018-07-03 ENCOUNTER — Encounter: Payer: Self-pay | Admitting: Physician Assistant

## 2018-07-03 ENCOUNTER — Telehealth: Payer: No Typology Code available for payment source | Admitting: Physician Assistant

## 2018-07-03 DIAGNOSIS — J029 Acute pharyngitis, unspecified: Secondary | ICD-10-CM

## 2018-07-03 MED ORDER — AMOXICILLIN 500 MG PO CAPS: 500.0000 mg | ORAL_CAPSULE | Freq: Two times a day (BID) | ORAL | 0 refills | Status: DC

## 2018-07-03 NOTE — Progress Notes (Signed)
We are sorry that you are not feeling well.  Here is how we plan to help!  Your symptoms indicate a likely viral infection (Pharyngitis).   Pharyngitis is inflammation in the back of the throat which can cause a sore throat, scratchiness and sometimes difficulty swallowing.   Pharyngitis is typically caused by a respiratory virus and will just run its course.  Please keep in mind that your symptoms could last up to 10 days. Since you have had the persistent sore throat, I have prescribed the antibiotic, Amoxicillin 500 mg one pill twice daily for 10 days. If your symptoms persist, I would recommend following up with your doctor for a face to face evaluation.      For throat pain, we recommend over the counter oral pain relief medications such as acetaminophen or aspirin, or anti-inflammatory medications such as ibuprofen or naproxen sodium.  Topical treatments such as oral throat lozenges or sprays may be used as needed.  Avoid close contact with loved ones, especially the very young and elderly.  Remember to wash your hands thoroughly throughout the day as this is the number one way to prevent the spread of infection and wipe down door knobs and counters with disinfectant.  After careful review of your answers, I would not recommend and antibiotic for your condition.  Antibiotics should not be used to treat conditions that we suspect are caused by viruses like the virus that causes the common cold or flu. However, some people can have Strep with atypical symptoms. You may need formal testing in clinic or office to confirm if your symptoms continue or worsen.  Providers prescribe antibiotics to treat infections caused by bacteria. Antibiotics are very powerful in treating bacterial infections when they are used properly.  To maintain their effectiveness, they should be used only when necessary.  Overuse of antibiotics has resulted in the development of super bugs that are resistant to treatment!    Home  Care:  Only take medications as instructed by your medical team.  Do not drink alcohol while taking these medications.  A steam or ultrasonic humidifier can help congestion.  You can place a towel over your head and breathe in the steam from hot water coming from a faucet.  Avoid close contacts especially the very young and the elderly.  Cover your mouth when you cough or sneeze.  Always remember to wash your hands.  Get Help Right Away If:  You develop worsening fever or throat pain.  You develop a severe head ache or visual changes.  Your symptoms persist after you have completed your treatment plan.  Make sure you  Understand these instructions.  Will watch your condition.  Will get help right away if you are not doing well or get worse.  Your e-visit answers were reviewed by a board certified advanced clinical practitioner to complete your personal care plan.  Depending on the condition, your plan could have included both over the counter or prescription medications.  If there is a problem please reply  once you have received a response from your provider.  Your safety is important to us.  If you have drug allergies check your prescription carefully.    You can use MyChart to ask questions about todays visit, request a non-urgent call back, or ask for a work or school excuse for 24 hours related to this e-Visit. If it has been greater than 24 hours you will need to follow up with your provider, or enter a new  e-Visit to address those concerns.  You will get an e-mail in the next two days asking about your experience.  I hope that your e-visit has been valuable and will speed your recovery. Thank you for using e-visits.   I spent 5-10 minutes on review and completion of this note- Lacy Duverney [facility]

## 2018-07-04 ENCOUNTER — Other Ambulatory Visit: Payer: Self-pay

## 2018-07-04 ENCOUNTER — Ambulatory Visit: Payer: PRIVATE HEALTH INSURANCE | Admitting: Physical Therapy

## 2018-07-04 DIAGNOSIS — M25512 Pain in left shoulder: Secondary | ICD-10-CM | POA: Diagnosis not present

## 2018-07-04 DIAGNOSIS — M546 Pain in thoracic spine: Secondary | ICD-10-CM

## 2018-07-04 NOTE — Therapy (Signed)
[facility] [place], [place], [address] Phone: [phone]   Fax:  [phone]  Physical Therapy Treatment  Patient Details  Name: Alvin Johnson MRN: [id number] Date of Birth: Feb 06, 1985 Referring Provider (PT): Lanell Personshomas Kingsley MD   Encounter Date: 07/04/2018  PT End of Session - 07/04/18 1452    Visit Number  5    Number of Visits  9    Date for PT Re-Evaluation  07/12/18    Authorization Type  workers comp 9 LV, case manager Marice PotterJulie Hughes fax number [phone]    Authorization - Visit Number  5    Authorization - Number of Visits  9    PT Start Time  1404    PT Stop Time  1450    PT Time Calculation (min)  46 min    Activity Tolerance  Patient tolerated treatment well    Behavior During Therapy  [facility] for tasks assessed/performed       No past medical history on file.  Past Surgical History:  Procedure Laterality Date  . TONSILLECTOMY    . VASECTOMY      There were no vitals filed for this visit.  Subjective Assessment - 07/04/18 1434    Subjective  Pt relays his shoulder is doing much better, he had a little pulling sensation with returning back to the gym for light activities but this has now gone away and he was also able to go kayaking over the weekend without shoulder complaints    Pertinent History  no significant PMH other than previous knee scope, but does say he saw Dr Ranell PatrickNorris in the past for Rt shoulder pain    Limitations  Lifting    How long can you sit comfortably?  not limited    How long can you stand comfortably?  not limited    How long can you walk comfortably?  not limited    Diagnostic tests  none on file in his chart    Patient Stated Goals  be able to work without pain and return to the gym without pain or weakness    Currently in Pain?  No/denies                       [facility] Treatment/Exercise - 07/04/18 0001      Shoulder Exercises: Prone   Other Prone  Exercises  Prone Y, T, W, extension all with 2 lbs, 10 reps each over Pball      Shoulder Exercises: Standing   External Rotation  Strengthening;Left    Theraband Level (Shoulder External Rotation)  Level 4 (Blue)    External Rotation Limitations  2X15    Internal Rotation  Strengthening;Left    Theraband Level (Shoulder Internal Rotation)  Level 4 (Blue)    Internal Rotation Limitations  2X15    Other Standing Exercises  shoulder flexion and abd, 2 sets of 15 with 5 lb, body blade 30 sec for flexion and abduction , A-P, inf-sup,     Other Standing Exercises  overhead shoulder press 9 lb 2X 10      Shoulder Exercises: ROM/Strengthening   UBE (Upper Arm Bike)  L5 3 min fwd/3 min backward    Lat Pull Limitations  55 lbs 2X15    Cybex Press Limitations  chest press machine 55 lbs 2 sets of 15 one set wide grip, one set narrow grip    Cybex Row Limitations  55 lbs 2 sets  of 15 reps, one wider grip, one narrow grip    Ball on Wall  15 reps with yellow weighted ball overall head up/down, lateral, circles      Shoulder Exercises: Stretch   Other Shoulder Stretches  doorway stretch 3 X 30 sec, IR stretch behind back 10 sec x 5, post capsule 10 sec X 5                  PT Long Term Goals - 07/04/18 1456      PT LONG TERM GOAL #1   Title  Pt will be I and compliant with HEP. (6 weeks 07/12/18)    Status  Achieved      PT LONG TERM GOAL #2   Title  Pt will report overall less than 1-2/10 pain with usual work duties. (6 weeks 07/12/18)    Baseline  reported no pain with this over the last week    Status  On-going      PT LONG TERM GOAL #3   Title  Pt will be able to return to his usual strength training program at gym. (6 weeks 07/12/18)    Baseline  returned to light strenghtening    Status  On-going      PT LONG TERM GOAL #4   Title  Pt will be able to lift, push, pull at least 50 lbs repetitively without complaints. (6 weeks 07/12/18)    Baseline  able to show this today 07/04/18  in clinic on weight machines    Status  Achieved            Plan - 07/04/18 1453    Clinical Impression Statement  Pt has made great progress with PT toward his functioal goals, he relays no complaints today with any recent activities but has not resumed his full strengthening program at the gym out of fear of reinjuring shoulder. He was progressed today with his strengthening program with good tolerance and recommended he increase his resistance about 10% each week as long as he has no pain and can use good form. He is doing quite well from a pain perspective but does still have some tightness in his Lt shoulder and upper trap with some mild IR stiffness. He will benefit from a couple more PT sessions to address this and to safely progress his strengthening.    PT Treatment/Interventions  ADLs/Self Care Home Management;Cryotherapy;Electrical Stimulation;Iontophoresis 4mg /ml Dexamethasone;Moist Heat;Traction;Ultrasound;Therapeutic activities;Therapeutic exercise;Neuromuscular re-education;Patient/family education;Manual techniques;Dry needling;Passive range of motion;Taping;Spinal Manipulations;Joint Manipulations    PT Next Visit Plan  consider DN if pt. agreeable, modalities prn, and MT for pain and spasm, needs scapular thoracic mobility, progress scapular strengthening and add functional lifting, push, pull as able    PT Home Exercise Plan  post capsule stretch, doorway stretch, UT stretch, T mobility with foam roller, H abd, row, ext, IR, ER with blue Tband, quadriped alt arm raises    Consulted and Agree with Plan of Care  Patient       Patient will benefit from skilled therapeutic intervention in order to improve the following deficits and impairments:  Decreased activity tolerance, Decreased endurance, Hypomobility  Visit Diagnosis: 1. Acute pain of left shoulder   2. Pain in thoracic spine        Problem List There are no active problems to display for this patient.   Birdie RiddleBrian  R Nelson,PT,DPT 07/04/2018, 2:59 PM  [facility] [address] [place], [place], [address] Phone: [phone]  Fax:  [phone]  Name: Alvin Johnson MRN: [id number] Date of Birth: 08/24/1985

## 2018-07-13 ENCOUNTER — Ambulatory Visit: Payer: PRIVATE HEALTH INSURANCE | Admitting: Physical Therapy

## 2018-07-17 ENCOUNTER — Ambulatory Visit: Payer: PRIVATE HEALTH INSURANCE | Attending: Family Medicine | Admitting: Physical Therapy

## 2018-07-17 ENCOUNTER — Other Ambulatory Visit: Payer: Self-pay

## 2018-07-17 ENCOUNTER — Encounter: Payer: Self-pay | Admitting: Physical Therapy

## 2018-07-17 DIAGNOSIS — M25512 Pain in left shoulder: Secondary | ICD-10-CM | POA: Insufficient documentation

## 2018-07-17 DIAGNOSIS — M546 Pain in thoracic spine: Secondary | ICD-10-CM | POA: Diagnosis present

## 2018-07-17 NOTE — Therapy (Signed)
[facility] [address] [place], [place], [address] Phone: [phone]   Fax:  [phone]  Physical Therapy Treatment/REcert  Patient Details  Name: Alvin Johnson MRN: [id number] Date of Birth: [birth year] Referring Provider (PT): Lanell Personshomas Kingsley MD   Encounter Date: 07/17/2018  PT End of Session - 07/17/18 1658    Visit Number  6    Number of Visits  9    Date for PT Re-Evaluation  08/14/18    Authorization Type  workers comp 9 LV, case manager Marice PotterJulie Hughes fax number [phone]    Authorization - Visit Number  6    Authorization - Number of Visits  9    PT Start Time  1605    PT Stop Time  1655    PT Time Calculation (min)  50 min    Activity Tolerance  Patient tolerated treatment well    Behavior During Therapy  [facility] for tasks assessed/performed       History reviewed. No pertinent past medical history.  Past Surgical History:  Procedure Laterality Date  . TONSILLECTOMY    . VASECTOMY      There were no vitals filed for this visit.  Subjective Assessment - 07/17/18 1652    Subjective  My shoulder is doing good, I had some pain and soreness for one day last week after doing some yardwork but it went away in one day after performing my exercises    Pertinent History  no significant PMH other than previous knee scope, but does say he saw Dr Ranell PatrickNorris in the past for Rt shoulder pain    Limitations  Lifting    How long can you sit comfortably?  not limited    How long can you stand comfortably?  not limited    How long can you walk comfortably?  not limited    Diagnostic tests  none on file in his chart    Patient Stated Goals  be able to work without pain and return to the gym without pain or weakness    Currently in Pain?  No/denies    Pain Onset  More than a month ago         [facility] - 07/17/18 0001      Assessment   Medical Diagnosis  Lt shoulder and scapular-thoracic pain/spasm    Referring  Provider (PT)  Lanell Personshomas Kingsley MD      Posture/Postural Control   Posture Comments  WNL      AROM   Overall AROM Comments  Lt shoulder WNL      Strength   Overall Strength Comments  now 5/5 MMT but does have less stability Rt vs left with stabilization exercises in push up position                   [facility] Adult [facility] Treatment/Exercise - 07/17/18 0001      Neuro Re-ed    Neuro Re-ed Details   scapular stabilization, see below      Shoulder Exercises: Prone   Other Prone Exercises  Prone Y, T, W, extension all with 2 lbs, 10 reps each over Pball    Other Prone Exercises  prone alt arm lifts from quadriped position X 10 bilat      Shoulder Exercises: Standing   External Rotation  Strengthening;Left    Theraband Level (Shoulder External Rotation)  Level 4 (Blue)    External Rotation Limitations  2X 20 with last set at 90 deg  flexion and abd    Internal Rotation  Strengthening;Left    Theraband Level (Shoulder Internal Rotation)  Level 4 (Blue)    Internal Rotation Limitations  2X20 with second set at 90 deg flex and abd    Other Standing Exercises  shoulder flexion and abd, 2 sets of 15 with 5 lb, body blade 30 sec for flexion and abduction , A-P, inf-sup, ball rolls OH for stability X 20 each way    Other Standing Exercises  overhead shoulder press 10 lb 2X 10      Shoulder Exercises: ROM/Strengthening   UBE (Upper Arm Bike)  L5 3 min fwd/3 min backward    Lat Pull Limitations  65 lbs 2X15    Cybex Press Limitations  chest press machine 65 lbs 2 sets of 15 one set wide grip, one set narrow grip    Cybex Row Limitations  65 lbs 2 sets of 15 reps, one wider grip, one narrow grip    Ball on Wall  20 reps with yellow weighted ball overall head up/down, lateral, circles      Shoulder Exercises: Stretch   Other Shoulder Stretches  doorway stretch 3 X 30 sec, IR stretch behind back 10 sec x 5, post capsule 10 sec X 5                  PT Long Term Goals - 07/04/18  1456      PT LONG TERM GOAL #1   Title  Pt will be I and compliant with HEP. (6 weeks 07/12/18)    Status  Achieved      PT LONG TERM GOAL #2   Title  Pt will report overall less than 1-2/10 pain with usual work duties. (6 weeks 07/12/18)    Baseline  reported no pain with this over the last week    Status  On-going      PT LONG TERM GOAL #3   Title  Pt will be able to return to his usual strength training program at gym. (6 weeks 07/12/18)    Baseline  returned to light strenghtening    Status  On-going      PT LONG TERM GOAL #4   Title  Pt will be able to lift, push, pull at least 50 lbs repetitively without complaints. (6 weeks 07/12/18)    Baseline  able to show this today 07/04/18 in clinic on weight machines    Status  Achieved            Plan - 07/17/18 1659    Clinical Impression Statement  Pt continues to make excellent progress with his shoulder. He now has full ROM and good strength but lacks a little scapular stability evident with closed chain UE weight bearing positions. He has 3 more approaved workers comp PT visits and will benefit from continuing PT for these last 3 visits to continue to progress his scapular stabilization and functional strength.    PT Frequency  1x / week    PT Treatment/Interventions  ADLs/Self Care Home Management;Cryotherapy;Electrical Stimulation;Iontophoresis 4mg /ml Dexamethasone;Moist Heat;Traction;Ultrasound;Therapeutic activities;Therapeutic exercise;Neuromuscular re-education;Patient/family education;Manual techniques;Dry needling;Passive range of motion;Taping;Spinal Manipulations;Joint Manipulations    PT Next Visit Plan  co modalities prn, and MT for pain and spasm, needs scapular thoracic mobility, progress scapular strengthening and add functional lifting, push, pull as able    PT Home Exercise Plan  post capsule stretch, doorway stretch, UT stretch, T mobility with foam roller, H abd, row, ext, IR, ER with blue  Tband, quadriped alt arm raises     Consulted and Agree with Plan of Care  Patient       Patient will benefit from skilled therapeutic intervention in order to improve the following deficits and impairments:  Decreased activity tolerance, Decreased endurance, Hypomobility  Visit Diagnosis: 1. Acute pain of left shoulder   2. Pain in thoracic spine        Problem List There are no active problems to display for this patient.   Birdie RiddleBrian R Kyrianna Barletta,PT,DPT 07/17/2018, 5:03 PM  [facility] [address] [place], [place], [address] Phone: [phone]   Fax:  [phone]  Name: Alvin Johnson MRN: [id number] Date of Birth: September 05, 1985

## 2018-07-26 ENCOUNTER — Ambulatory Visit: Payer: PRIVATE HEALTH INSURANCE | Admitting: Physical Therapy

## 2018-07-29 ENCOUNTER — Other Ambulatory Visit: Payer: Self-pay

## 2018-07-29 ENCOUNTER — Ambulatory Visit (HOSPITAL_COMMUNITY)
Admission: EM | Admit: 2018-07-29 | Discharge: 2018-07-29 | Disposition: A | Discharge status: Home / Self Care | Payer: No Typology Code available for payment source | Attending: Emergency Medicine | Admitting: Emergency Medicine

## 2018-07-29 DIAGNOSIS — J019 Acute sinusitis, unspecified: Secondary | ICD-10-CM

## 2018-07-29 MED ORDER — DOXYCYCLINE HYCLATE 100 MG PO CAPS: 100.0000 mg | ORAL_CAPSULE | Freq: Two times a day (BID) | ORAL | 0 refills | Status: DC

## 2018-07-29 NOTE — ED Triage Notes (Signed)
Per pt the has been having post nasal drainage for 2 weeks then 2 days ago started having nasal congestion with sneezing cough and slight sore throat. Pt has had no fevers, no chills. No SOB.

## 2018-07-29 NOTE — ED Provider Notes (Signed)
[facility]    CSN: [id number] Arrival date & time: 07/29/18  1233     History   Chief Complaint Chief Complaint  Patient presents with  . Nasal Congestion  . Cough    HPI Alvin Johnson is a 33 y.o. male.   Patient presents with history of sinus congestion x3 weeks.  He has now developed cough productive of yellow phlegm, sneezing, sore throat over the past week.  He denies fever, chills, shortness of breath, rash, vomiting, diarrhea.  The history is provided by the patient.    No past medical history on file.  There are no active problems to display for this patient.   Past Surgical History:  Procedure Laterality Date  . TONSILLECTOMY    . VASECTOMY         Home Medications    Prior to Admission medications   Medication Sig Start Date End Date Taking? Authorizing Provider  calcium carbonate (TUMS EX) 750 MG chewable tablet Chew 1 tablet by mouth daily.    [provider]  Cholecalciferol (VITAMIN D) 2000 UNITS CAPS Take 1 capsule by mouth daily.    [provider]  docusate sodium (COLACE) 100 MG capsule Take 1 capsule (100 mg total) by mouth 2 (two) times daily. 12/10/17   Wieters, Hallie C, PA-C  doxycycline (VIBRAMYCIN) 100 MG capsule Take 1 capsule (100 mg total) by mouth 2 (two) times daily. 07/29/18   Sharion Balloon, NP  Multiple Vitamins-Minerals (MENS MULTIVITAMIN PLUS) TABS Take 1 tablet by mouth daily.    [provider]  omeprazole (PRILOSEC) 20 MG capsule Take 1 capsule (20 mg total) by mouth daily. 06/21/18   Zigmund Gottron, NP  vitamin C (ASCORBIC ACID) 500 MG tablet Take 500 mg by mouth daily.    [provider]    Family History Family History  Problem Relation Age of Onset  . Hyperlipidemia Father   . Hypertension Father   . CAD Father   . CAD Paternal Grandfather     Social History Social History   Tobacco Use  . Smoking status: Never Smoker  . Smokeless tobacco: Never Used  Substance  Use Topics  . Alcohol use: Yes    Alcohol/week: 0.0 standard drinks  . Drug use: No     Allergies   Patient has no known allergies.   Review of Systems Review of Systems  Constitutional: Negative for chills and fever.  HENT: Positive for congestion, sinus pressure and sore throat. Negative for ear pain.   Eyes: Negative for pain and visual disturbance.  Respiratory: Positive for cough. Negative for shortness of breath.   Cardiovascular: Negative for chest pain and palpitations.  Gastrointestinal: Negative for abdominal pain, diarrhea and vomiting.  Genitourinary: Negative for dysuria and hematuria.  Musculoskeletal: Negative for arthralgias and back pain.  Skin: Negative for color change and rash.  Neurological: Negative for seizures and syncope.  All other systems reviewed and are negative.    Physical Exam Triage Vital Signs ED Triage Vitals [07/29/18 1318]  Enc Vitals Group     BP 123/70     Pulse Rate 84     Resp 16     Temp 98.3 F (36.8 C)     Temp Source Oral     SpO2 99 %     Weight      Height      Head Circumference      Peak Flow      Pain Score 2  Pain Loc      Pain Edu?      Excl. in GC?    No data found.  Updated Vital Signs BP 123/70 (BP Location: Right Arm)   Pulse 84   Temp 98.3 F (36.8 C) (Oral)   Resp 16   SpO2 99%   Visual Acuity Right Eye Distance:   Left Eye Distance:   Bilateral Distance:    Right Eye Near:   Left Eye Near:    Bilateral Near:     Physical Exam Vitals signs and nursing note reviewed.  Constitutional:      Appearance: He is well-developed.  HENT:     Head: Normocephalic and atraumatic.     Right Ear: Tympanic membrane normal.     Left Ear: Tympanic membrane normal.     Nose: Congestion present.     Mouth/Throat:     Mouth: Mucous membranes are moist.     Pharynx: Oropharynx is clear.  Eyes:     Conjunctiva/sclera: Conjunctivae normal.  Neck:     Musculoskeletal: Neck supple.  Cardiovascular:      Rate and Rhythm: Normal rate and regular rhythm.  Pulmonary:     Effort: Pulmonary effort is normal. No respiratory distress.     Breath sounds: Normal breath sounds.  Abdominal:     Palpations: Abdomen is soft.     Tenderness: There is no abdominal tenderness.  Skin:    General: Skin is warm and dry.  Neurological:     Mental Status: He is alert.      UC Treatments / Results  Labs (all labs ordered are listed, but only abnormal results are displayed) Labs Reviewed - No data to display  EKG   Radiology No results found.  Procedures Procedures (including critical care time)  Medications Ordered in UC Medications - No data to display  Initial Impression / Assessment and Plan / UC Course  I have reviewed the triage vital signs and the nursing notes.  Pertinent labs & imaging results that were available during my care of the patient were reviewed by me and considered in my medical decision making (see chart for details).   Acute sinusitis.  Treating today with doxycycline.  Instructed patient to follow-up with his primary care provider in 1 week if symptoms are not improving.  Instructed patient to return here or follow-up with his PCP if he develops fever, shortness of breath, rash, vomiting, diarrhea, other symptoms.     Final Clinical Impressions(s) / UC Diagnoses   Final diagnoses:  Acute non-recurrent sinusitis, unspecified location     Discharge Instructions     You have a sinus infection.  Take the prescribed doxycycline antibiotic as directed.  Follow-up with your primary care provider in 1 week if your symptoms are not improving.    Return here or follow-up with your primary care provider if you develop fever, shortness of breath, rash, vomiting, diarrhea, or other concerning symptoms.        ED Prescriptions    Medication Sig Dispense Auth. Provider   doxycycline (VIBRAMYCIN) 100 MG capsule Take 1 capsule (100 mg total) by mouth 2 (two) times daily.  20 capsule Mickie Bailate, Aadith Raudenbush H, NP     Controlled Substance Prescriptions [place] Controlled Substance Registry consulted? Not Applicable   Mickie Bailate, Massie Cogliano H, NP 07/29/18 1417

## 2018-07-29 NOTE — Discharge Instructions (Signed)
You have a sinus infection.  Take the prescribed doxycycline antibiotic as directed.  Follow-up with your primary care provider in 1 week if your symptoms are not improving.    Return here or follow-up with your primary care provider if you develop fever, shortness of breath, rash, vomiting, diarrhea, or other concerning symptoms.

## 2018-08-01 ENCOUNTER — Ambulatory Visit: Payer: PRIVATE HEALTH INSURANCE | Admitting: Physical Therapy

## 2018-08-17 ENCOUNTER — Other Ambulatory Visit: Payer: Self-pay

## 2018-08-17 ENCOUNTER — Ambulatory Visit: Payer: PRIVATE HEALTH INSURANCE | Attending: Family Medicine | Admitting: Physical Therapy

## 2018-08-17 DIAGNOSIS — M25512 Pain in left shoulder: Secondary | ICD-10-CM | POA: Insufficient documentation

## 2018-08-17 DIAGNOSIS — M546 Pain in thoracic spine: Secondary | ICD-10-CM | POA: Insufficient documentation

## 2018-08-17 NOTE — Therapy (Signed)
[facility] [address] [place], [place], [address] Phone: [phone]   Fax:  [phone]  Physical Therapy Treatment/Discharge PHYSICAL THERAPY DISCHARGE SUMMARY  Visits from Start of Care: 7  Current functional level related to goals / functional outcomes: Back to baseline   Remaining deficits: none   Education / Equipment: HEP Plan: Patient agrees to discharge.  Patient goals were met. Patient is being discharged due to meeting the stated rehab goals.  ?????       Patient Details  Name: Alvin Johnson MRN: [id number] Date of Birth: 09/06/1985 Referring Provider (PT): Thomas Kingsley MD   Encounter Date: 08/17/2018  PT End of Session - 08/17/18 1020    Visit Number  7   pt to discharge this visit   Number of Visits  9    Date for PT Re-Evaluation  08/14/18    Authorization Type  workers comp [employer], case manager Julie Hughes fax number [phone]    Authorization - Visit Number  7    Authorization - Number of Visits  9    PT Start Time  0915    PT Stop Time  0955    PT Time Calculation (min)  40 min    Activity Tolerance  Patient tolerated treatment well    Behavior During Therapy  WFL for tasks assessed/performed       No past medical history on file.  Past Surgical History:  Procedure Laterality Date  . TONSILLECTOMY    . VASECTOMY      There were no vitals filed for this visit.  Subjective Assessment - 08/17/18 1012    Subjective  My shoulder is great, back to baseline, no complaints, I have joined [employer] and can workout without any pain and difficulty, I have no difficulty at work.    Pertinent History  no significant PMH other than previous knee scope, but does say he saw Dr Norris in the past for Rt shoulder pain    Limitations  Lifting    How long can you sit comfortably?  not limited    How long can you stand comfortably?  not limited    How long can you walk comfortably?  not limited    Diagnostic tests  none on file in his chart    Patient Stated Goals  be able to work without pain and return to the gym without pain or weakness    Currently in Pain?  No/denies    Pain Onset  More than a month ago         [facility] PT Assessment - 08/17/18 0001      Assessment   Medical Diagnosis  Lt shoulder and scapular-thoracic pain/spasm    Referring Provider (PT)  Thomas Kingsley MD    Next MD Visit  08/18/18      Posture/Postural Control   Posture Comments  WNL      AROM   Overall AROM Comments  Lt shoulder WNL      Strength   Overall Strength Comments  now 5/5/ MMT with good stability                   [facility] Adult PT Treatment/Exercise - 08/17/18 0001      Shoulder Exercises: Prone   Other Prone Exercises  prone high plank 30 sec X 2, then high plank shoulder taps X 10 ea, then high plank rotations X 10 ea, then walking hands laterally         Shoulder Exercises: Standing   External Rotation  Strengthening;Left;Right;20 reps    Theraband Level (Shoulder External Rotation)  Level 4 (Blue)    Internal Rotation  Strengthening;Left;Right;20 reps    Theraband Level (Shoulder Internal Rotation)  Level 4 (Blue)    Other Standing Exercises  TRX rows, ext, Y,T,W X 15 ea      Shoulder Exercises: Stretch   Other Shoulder Stretches  doorway stretch 3 X 30 sec, IR stretch behind back 10 sec x 5, post capsule 10 sec X 5             PT Education - 08/17/18 1020    Education Details  POC to discharge    Person(s) Educated  Patient    Methods  Explanation    Comprehension  Verbalized understanding          PT Long Term Goals - 08/17/18 1023      PT LONG TERM GOAL #1   Title  Pt will be I and compliant with HEP. (6 weeks 07/12/18)    Status  Achieved      PT LONG TERM GOAL #2   Title  Pt will report overall less than 1-2/10 pain with usual work duties. (6 weeks 07/12/18)    Baseline  reported no pain with this over the last 2 weeks    Status  Achieved       PT LONG TERM GOAL #3   Title  Pt will be able to return to his usual strength training program at gym. (6 weeks 07/12/18)    Baseline  returned to normal strenghtening    Status  Achieved      PT LONG TERM GOAL #4   Title  Pt will be able to lift, push, pull at least 50 lbs repetitively without complaints. (6 weeks 07/12/18)    Baseline  able to show this today 07/04/18 in clinic on weight machines    Status  Achieved            Plan - 08/17/18 1021    Clinical Impression Statement  He has made excellent progress and has met all PT goals. He is back to full duty work without complaints and back to normal workout routine with addition of HEP without complaints. He will be discharged due to progress and making all goals. He is in agreement and has no further questions or concerns.    PT Frequency  1x / week    PT Treatment/Interventions  ADLs/Self Care Home Management;Cryotherapy;Electrical Stimulation;Iontophoresis 40m/ml Dexamethasone;Moist Heat;Traction;Ultrasound;Therapeutic activities;Therapeutic exercise;Neuromuscular re-education;Patient/family education;Manual techniques;Dry needling;Passive range of motion;Taping;Spinal Manipulations;Joint Manipulations    PT Next Visit Plan  will discharge this visit    PT Home Exercise Plan  post capsule stretch, doorway stretch, UT stretch, T mobility with foam roller, H abd, row, ext, IR, ER with blue Tband, quadriped alt arm raises    Consulted and Agree with Plan of Care  Patient       Patient will benefit from skilled therapeutic intervention in order to improve the following deficits and impairments:  Decreased activity tolerance, Decreased endurance, Hypomobility  Visit Diagnosis: 1. Acute pain of left shoulder   2. Pain in thoracic spine        Problem List There are no active problems to display for this patient.   BSilvestre Mesi8/06/2018, 10:24 AM  [facility] [address]Phone: [phone]  Fax:  [phone] Name: Alvin Johnson  MRN: [id number] Date of Birth: 02/22/1985

## 2018-09-13 MED FILL — MELOXICAM 15 MG TABLET: 15 | 14 days supply | Qty: 14 | Fill #0

## 2018-09-26 ENCOUNTER — Other Ambulatory Visit (HOSPITAL_COMMUNITY): Payer: Self-pay | Admitting: Orthopedic Surgery

## 2018-09-26 ENCOUNTER — Other Ambulatory Visit: Payer: Self-pay | Admitting: Orthopedic Surgery

## 2018-09-26 DIAGNOSIS — M25511 Pain in right shoulder: Secondary | ICD-10-CM

## 2018-10-04 ENCOUNTER — Other Ambulatory Visit: Payer: Self-pay

## 2018-10-04 ENCOUNTER — Ambulatory Visit (HOSPITAL_COMMUNITY)
Admission: RE | Admit: 2018-10-04 | Discharge: 2018-10-04 | Disposition: A | Discharge status: Home / Self Care | Payer: No Typology Code available for payment source | Source: Ambulatory Visit | Attending: Orthopedic Surgery | Admitting: Orthopedic Surgery

## 2018-10-04 DIAGNOSIS — M25511 Pain in right shoulder: Secondary | ICD-10-CM | POA: Insufficient documentation

## 2019-02-19 MED FILL — predniSONE 20 MG TABS: 20 | 9 days supply | Qty: 18 | Fill #0

## 2019-02-22 ENCOUNTER — Other Ambulatory Visit: Payer: Self-pay

## 2019-02-22 ENCOUNTER — Ambulatory Visit: Payer: No Typology Code available for payment source | Attending: Family Medicine

## 2019-02-22 DIAGNOSIS — M5432 Sciatica, left side: Secondary | ICD-10-CM | POA: Diagnosis not present

## 2019-02-22 DIAGNOSIS — M6281 Muscle weakness (generalized): Secondary | ICD-10-CM | POA: Insufficient documentation

## 2019-02-22 NOTE — Patient Instructions (Signed)
childs pose and bilateral knee to chest.   2-3 reps 30 sec 2-3x/day

## 2019-02-22 NOTE — Therapy (Signed)
[place], [place], [address] Phone: [phone]   Fax:  [phone]  Physical Therapy Evaluation  Patient Details  Name: Alvin Johnson MRN: [id number] Date of Birth: Nov 01, 1985 Referring Provider (PT): Antony Contras, MD   Encounter Date: 02/22/2019  PT End of Session - 02/22/19 1710    Visit Number  1    Number of Visits  12    Date for PT Re-Evaluation  04/06/19    Authorization Type  [facility] focus    PT Start Time  0410    PT Stop Time  0500    PT Time Calculation (min)  50 min    Activity Tolerance  Patient tolerated treatment well    Behavior During Therapy  [facility] for tasks assessed/performed       History reviewed. No pertinent past medical history.  Past Surgical History:  Procedure Laterality Date  . TONSILLECTOMY    . VASECTOMY      There were no vitals filed for this visit.   Subjective Assessment - 02/22/19 1611    Subjective  He reports  LT sciatic pain into calf and feet for 09/2018.  Was  lifting wood in recently and worsened   Nothing specific return of symptoms without injury.  MD placed him on steroids and sent to PT    Limitations  Lifting    How long can you sit comfortably?  as needed    How long can you stand comfortably?  varies    How long can you walk comfortably?  as needed    Diagnostic tests  none    Patient Stated Goals  He wants to  decrease pain and return to exercise and lifting.    Currently in Pain?  Yes    Pain Score  3     Pain Location  Back    Pain Orientation  Right;Left;Lower    Pain Descriptors / Indicators  Numbness   pinch in calf   Pain Type  Chronic pain    Pain Radiating Towards  LT leg to foot and RT lower leg/foot    Pain Onset  More than a month ago    Pain Frequency  Constant    Aggravating Factors   standing , pushing /pulling ,    Pain Relieving Factors  stretch hams, psoas         [facility] PT Assessment - 02/22/19 0001      Assessment    Medical Diagnosis  sciatica    Referring Provider (PT)  Antony Contras, MD    Onset Date/Surgical Date  --   09/2018   Next MD Visit  As needed    Prior Therapy  for shoulder      Precautions   Precautions  None      Restrictions   Weight Bearing Restrictions  No      Balance Screen   Has the patient fallen in the past 6 months  No      Prior Function   Level of Independence  Independent      Cognition   Overall Cognitive Status  Within Functional Limits for tasks assessed      ROM / Strength   AROM / PROM / Strength  AROM;PROM;Strength      AROM   Overall AROM Comments  Movement into Lt and Rt quadrant  was negative     AROM Assessment Site  Lumbar    Lumbar Flexion  60  Lumbar Extension  30    Lumbar - Right Side Bend  20    Lumbar - Left Side Bend  20      PROM   Overall PROM Comments  childs pose no incr pain . On elbows  prone on elbows mild inc in L heel       Strength   Overall Strength Comments  hip ext and abduct  4/5 bilaterally      Flexibility   Soft Tissue Assessment /Muscle Length  yes    Hamstrings  SLR 60 degrees bilateral without increased leg pain.     Quadriceps  WFl     ITB  moderate tightness ,  mild decr LT hip ext   ,  Lt PSIS lower     Piriformis  negative      Palpation   SI assessment   In supine RT leg postured ER,  Lt asis and crest more proximal     Palpation comment  Stiff Lt AP iliac crest.       Special Tests    Special Tests  Lumbar    Lumbar Tests  Slump Test      Slump test   Comment  RT and LT very mild into posterior thighs so not conclusive      Ambulation/Gait   Gait Comments  Normal                Objective measurements completed on examination: See above findings.              PT Education - 02/22/19 1708    Education Details  POc,  HEp, Inersion table daily 10-15 min as tolerated no incr pain/symptoms    Person(s) Educated  Patient    Methods  Explanation;Tactile cues;Verbal cues;Handout     Comprehension  Verbalized understanding;Returned demonstration       PT Short Term Goals - 02/22/19 1719      PT SHORT TERM GOAL #1   Title  He will be indpendent with initial hEP    Time  3    Period  Weeks    Status  New      PT SHORT TERM GOAL #2   Title  he will report pain / leg symptoms decreased 25% or more    Time  3    Period  Weeks    Status  New        PT Long Term Goals - 02/22/19 1720      PT LONG TERM GOAL #1   Title  Pt will be I and compliant with HEP.All issued    Time  6    Period  Weeks    Status  New      PT LONG TERM GOAL #2   Title  Pt will report overall less than 1-2/10 pain with usual work duties and no leg symptoms    Time  6    Period  Weeks    Status  New      PT LONG TERM GOAL #3   Title  Pt will be able to return to his usual strength training program at gym.    Time  6    Period  Weeks    Status  New      PT LONG TERM GOAL #4   Title  Pt will be able to lift, push, pull at least 50 lbs repetitively without complaints.    Time  6    Period  Weeks  Status  New             Plan - 02/22/19 1711    Clinical Impression Statement  Mr Wyatt presents with LT >RT leg symptoms of pain and parasthesisas to lower legs and feet. It appears extension of lumbar spine increased symptoms and flexion does not but this is not clear as flexion postures did not always decrease symptoms but extension posisiotns definately caused incr. symptoms.   SLR and sitting slump testing did not significantly incr symptoms.  Traction did not incr symptoms.    He has some asymetries in pelvis in supine.   He may improved with skilled PT  and HEp.    Personal Factors and Comorbidities  Time since onset of injury/illness/exacerbation    Examination-Activity Limitations  Carry;Lift    Examination-Participation Restrictions  Community Activity   heavies home tasks   Stability/Clinical Decision Making  Stable/Uncomplicated    Clinical Decision Making  Low     Rehab Potential  Good    PT Frequency  2x / week    PT Duration  6 weeks    PT Treatment/Interventions  Cryotherapy;Moist Heat;Electrical Stimulation;Therapeutic exercise;Patient/family education;Iontophoresis 4mg /ml Dexamethasone;Taping;Therapeutic activities    PT Next Visit Plan  REveiew stretching ,    neural tension stretches/ traction,  stab  exercises.  add to HEP as needed for stretching . strength hips /core    PT Home Exercise Plan  chlds pose , bilateral knee to chest    Consulted and Agree with Plan of Care  Patient       Patient will benefit from skilled therapeutic intervention in order to improve the following deficits and impairments:  Pain, Decreased activity tolerance, Impaired flexibility, Postural dysfunction, Decreased range of motion, Decreased strength  Visit Diagnosis: Sciatica, left side  Muscle weakness (generalized)     Problem List There are no problems to display for this patient.    PT 02/22/2019, 5:24 PM  [facility] [address] [place], [place], [place] Phone: [phone]   Fax:  [phone]  Name: Alvin Johnson MRN: [name] Date of Birth: 07/09/1985

## 2019-02-26 ENCOUNTER — Ambulatory Visit: Payer: No Typology Code available for payment source

## 2019-02-26 ENCOUNTER — Other Ambulatory Visit: Payer: Self-pay | Admitting: Family Medicine

## 2019-02-26 DIAGNOSIS — M6281 Muscle weakness (generalized): Secondary | ICD-10-CM

## 2019-02-26 DIAGNOSIS — M5432 Sciatica, left side: Secondary | ICD-10-CM | POA: Diagnosis not present

## 2019-02-27 ENCOUNTER — Other Ambulatory Visit: Payer: Self-pay | Admitting: Family Medicine

## 2019-02-27 NOTE — Therapy (Addendum)
[place], [place], [address] Phone: [phone]   Fax:  [phone]  Physical Therapy Treatment/Discharge  Patient Details  Name: DARDEN FLEMISTER MRN: [id number] Date of Birth: October 09, 1985 Referring Provider (PT): Antony Contras, MD   Encounter Date: 02/26/2019  PT End of Session - 02/27/19 0656    Visit Number  2    Number of Visits  12    Date for PT Re-Evaluation  04/06/19    Authorization Type  [facility]    PT Start Time  0700    PT Stop Time  0740    PT Time Calculation (min)  40 min    Activity Tolerance  Patient tolerated treatment well    Behavior During Therapy  [facility] for tasks assessed/performed       No past medical history on file.  Past Surgical History:  Procedure Laterality Date  . TONSILLECTOMY    . VASECTOMY      There were no vitals filed for this visit.  Subjective Assessment - 02/27/19 0643    Subjective  No changes . did HEP    Pain Score  3     Pain Orientation  Right;Left;Lower    Pain Descriptors / Indicators  Numbness    Pain Type  Chronic pain    Pain Radiating Towards  LT and RT lower leg foot/heel    Pain Onset  More than a month ago    Pain Frequency  Constant    Aggravating Factors   stand push pull    Pain Relieving Factors  stretching                       [facility] Adult PT Treatment/Exercise - 02/27/19 0001      Exercises   Exercises  Lumbar      Lumbar Exercises: Stretches   Double Knee to Chest Stretch  1 rep;30 seconds    Other Lumbar Stretch Exercise  sitting neural tension stretches RT and Lt for HEP      Lumbar Exercises: Aerobic   Nustep  Ue/Le L5 % min      Lumbar Exercises: Supine   Pelvic Tilt  5 reps;5 seconds    Clam  10 reps    Clam Limitations  RT/LT with PPT     Bent Knee Raise  10 reps    Bent Knee Raise Limitations  RT/LT  with PPT      Manual Therapy   Manual Therapy  Joint mobilization    Joint Mobilization  AP  glide LT sacrum                PT Short Term Goals - 02/22/19 1719      PT SHORT TERM GOAL #1   Title  He will be indpendent with initial hEP    Time  3    Period  Weeks    Status  New      PT SHORT TERM GOAL #2   Title  he will report pain / leg symptoms decreased 25% or more    Time  3    Period  Weeks    Status  New        PT Long Term Goals - 02/22/19 1720      PT LONG TERM GOAL #1   Title  Pt will be I and compliant with HEP.All issued    Time  6    Period  Weeks  Status  New      PT LONG TERM GOAL #2   Title  Pt will report overall less than 1-2/10 pain with usual work duties and no leg symptoms    Time  6    Period  Weeks    Status  New      PT LONG TERM GOAL #3   Title  Pt will be able to return to his usual strength training program at gym.    Time  6    Period  Weeks    Status  New      PT LONG TERM GOAL #4   Title  Pt will be able to lift, push, pull at least 50 lbs repetitively without complaints.    Time  6    Period  Weeks    Status  New            Plan - 02/27/19 8616    Clinical Impression Statement  No change . Added some dore strength exercises andneural tension stretching . Plan to review this and add to core strength    PT Treatment/Interventions  Cryotherapy;Moist Heat;Electrical Stimulation;Therapeutic exercise;Patient/family education;Iontophoresis 77m/ml Dexamethasone;Taping;Therapeutic activities    PT Next Visit Plan  REveiew stretching ,    traction,  stab  exercises.  add to HEP as needed for stretching . strength hips /core    PT Home Exercise Plan  chlds pose , bilateral knee to chest, seated neural tension stretch , PPT  PPT with ball swqueeze  and PPT with band clam,    Consulted and Agree with Plan of Care  Patient       Patient will benefit from skilled therapeutic intervention in order to improve the following deficits and impairments:  Pain, Decreased activity tolerance, Impaired flexibility, Postural  dysfunction, Decreased range of motion, Decreased strength  Visit Diagnosis: Sciatica, left side  Muscle weakness (generalized)     Problem List There are no problems to display for this patient.   CDarrel Hoover PT 02/27/2019, 6:58 AM  [facility] [address]Phone: [phone]  Fax:  [phone] Name: JMAXIMILIANO CROMARTIEMRN: [id number] of Birth: 312-08-1985 PHYSICAL THERAPY DISCHARGE SUMMARY  Visits from Start of Care: 2  Current functional level related to goals / functional outcomes: He returned to MD and an MRI was done and this was negative He has not returned since this visit   Remaining deficits: Unknown   Education / Equipment: HEP Plan:                                                    Patient goals were not met. Patient is being discharged due to not returning since the last visit.  ?????    SPearson Forster PT   04/26/19

## 2019-03-01 ENCOUNTER — Other Ambulatory Visit: Payer: Self-pay

## 2019-03-01 ENCOUNTER — Other Ambulatory Visit: Payer: No Typology Code available for payment source

## 2019-03-01 ENCOUNTER — Ambulatory Visit
Admission: RE | Admit: 2019-03-01 | Discharge: 2019-03-01 | Disposition: A | Discharge status: Home / Self Care | Payer: No Typology Code available for payment source | Source: Ambulatory Visit | Attending: Family Medicine | Admitting: Family Medicine

## 2019-03-01 DIAGNOSIS — M5432 Sciatica, left side: Secondary | ICD-10-CM

## 2019-03-03 ENCOUNTER — Other Ambulatory Visit: Payer: Self-pay | Admitting: Family Medicine

## 2019-03-06 ENCOUNTER — Ambulatory Visit: Payer: No Typology Code available for payment source | Admitting: Physical Therapy

## 2019-03-09 ENCOUNTER — Encounter: Payer: No Typology Code available for payment source | Admitting: Physical Therapy

## 2019-03-13 ENCOUNTER — Ambulatory Visit: Payer: No Typology Code available for payment source

## 2019-03-16 ENCOUNTER — Encounter: Payer: No Typology Code available for payment source | Admitting: Physical Therapy

## 2019-04-30 MED FILL — DOXYCYCLINE HYCLATE 100 MG: 100 | 7 days supply | Qty: 14 | Fill #0

## 2019-06-07 MED FILL — OMEPRAZOLE 40 MG CPDR: 40 | 30 days supply | Qty: 30 | Fill #0

## 2019-06-21 ENCOUNTER — Encounter: Payer: Self-pay | Admitting: Gastroenterology

## 2019-07-04 ENCOUNTER — Other Ambulatory Visit: Payer: Self-pay | Admitting: Physician Assistant

## 2019-07-04 DIAGNOSIS — R1013 Epigastric pain: Secondary | ICD-10-CM

## 2019-07-04 DIAGNOSIS — R1011 Right upper quadrant pain: Secondary | ICD-10-CM

## 2019-07-05 ENCOUNTER — Other Ambulatory Visit: Payer: No Typology Code available for payment source

## 2019-07-09 ENCOUNTER — Encounter: Payer: Self-pay | Admitting: Physician Assistant

## 2019-07-09 ENCOUNTER — Ambulatory Visit: Payer: No Typology Code available for payment source | Admitting: Physician Assistant

## 2019-07-09 VITALS — BP 120/70 | HR 76 | Ht 69.25 in | Wt 164.0 lb

## 2019-07-09 DIAGNOSIS — K219 Gastro-esophageal reflux disease without esophagitis: Secondary | ICD-10-CM

## 2019-07-09 DIAGNOSIS — R1013 Epigastric pain: Secondary | ICD-10-CM

## 2019-07-09 NOTE — Progress Notes (Signed)
Chief Complaint: Upper abdominal pain  HPI:    Alvin Johnson is a 34 year old male with a past medical history as listed below, who was referred to me by Johny Blamer, MD for a complaint of upper abdominal pain.      07/03/2019 patient seen by PCP for ongoing stomach pain.  At that time had a CBC, BMP, lipase and hepatic panel which were all normal.  H. pylori testing was also normal.  He was scheduled for an abdominal ultrasound.    Today, the patient presents to clinic and tells me over the past 4 weeks he has had an epigastric burning/stinging pain.  This comes and goes throughout the day and is not always consistent.  Sometimes he also feels this pain is left upper quadrant, other times in his right upper quadrant occasionally.  Also describes feeling very full.  His Omeprazole was increased to 20 mg twice daily about 3 weeks ago and patient tells me over the past 4 days he has actually felt slightly better with all of his symptoms.  Denies any associated nausea or vomiting.  Tells me he does have a history of reflux which seems to come and go and can be there for a month at a time, but then if he changes his diet will go away for 6 months.  He has not had reflux in combination with this pain.  Also tells me he started taking probiotic which has helped his stools be more "clean".    Describes a day of seeing some possible blood in his stool but tells me that he ate some red sauce at the time and has not seen it since.    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  Does describe an increase in stress recently with his on-call hours and building a home, etc.    Denies fever, chills, weight loss, melena or symptoms that awaken him from sleep.  No past medical history on file.  Past Surgical History:  Procedure Laterality Date  . TONSILLECTOMY    . VASECTOMY      Current Outpatient Medications  Medication Sig Dispense Refill  . calcium carbonate (TUMS EX) 750 MG chewable  tablet Chew 1 tablet by mouth daily.    . Cholecalciferol (VITAMIN D) 2000 UNITS CAPS Take 1 capsule by mouth daily.    Marland Kitchen docusate sodium (COLACE) 100 MG capsule Take 1 capsule (100 mg total) by mouth 2 (two) times daily. (Patient not taking: Reported on 02/22/2019) 10 capsule 0  . doxycycline (VIBRAMYCIN) 100 MG capsule Take 1 capsule (100 mg total) by mouth 2 (two) times daily. (Patient not taking: Reported on 02/22/2019) 20 capsule 0  . Multiple Vitamins-Minerals (MENS MULTIVITAMIN PLUS) TABS Take 1 tablet by mouth daily.    Marland Kitchen omeprazole (PRILOSEC) 20 MG capsule Take 1 capsule (20 mg total) by mouth daily. (Patient not taking: Reported on 02/22/2019) 30 capsule 0  . predniSONE (STERAPRED UNI-PAK 21 TAB) 10 MG (21) TBPK tablet Take by mouth daily.    . vitamin C (ASCORBIC ACID) 500 MG tablet Take 500 mg by mouth daily.     No current facility-administered medications for this visit.    Allergies as of 07/09/2019  . (No Known Allergies)    Family History  Problem Relation Age of Onset  . Hyperlipidemia Father   . Hypertension Father   . CAD Father   . CAD Paternal Grandfather     Social History   Socioeconomic History  .  Marital status: Married    Spouse name: Not on file  . Number of children: Not on file  . Years of education: Not on file  . Highest education level: Not on file  Occupational History  . Not on file  Tobacco Use  . Smoking status: Never Smoker  . Smokeless tobacco: Never Used  Substance and Sexual Activity  . Alcohol use: Yes    Alcohol/week: 0.0 standard drinks  . Drug use: No  . Sexual activity: Not on file  Other Topics Concern  . Not on file  Social History Narrative  . Not on file   Social Determinants of Health   Financial Resource Strain:   . Difficulty of Paying Living Expenses:   Food Insecurity:   . Worried About Programme researcher, broadcasting/film/video in the Last Year:   . Barista in the Last Year:   Transportation Needs:   . Freight forwarder  (Medical):   Marland Kitchen Lack of Transportation (Non-Medical):   Physical Activity:   . Days of Exercise per Week:   . Minutes of Exercise per Session:   Stress:   . Feeling of Stress :   Social Connections:   . Frequency of Communication with Friends and Family:   . Frequency of Social Gatherings with Friends and Family:   . Attends Religious Services:   . Active Member of Clubs or Organizations:   . Attends Banker Meetings:   Marland Kitchen Marital Status:   Intimate Partner Violence:   . Fear of Current or Ex-Partner:   . Emotionally Abused:   Marland Kitchen Physically Abused:   . Sexually Abused:     Review of Systems:    Constitutional: No weight loss, fever or chills Skin: No rash  Cardiovascular: No chest pain Respiratory: No SOB  Gastrointestinal: See HPI and otherwise negative Genitourinary: No dysuria Neurological: No headache Musculoskeletal: No new muscle or joint pain Hematologic: No bleeding  Psychiatric: No history of depression or anxiety   Physical Exam:  Vital signs: BP 120/70 (BP Location: Left Arm, Patient Position: Sitting, Cuff Size: Normal)   Pulse 76   Ht 5' 9.25" (1.759 m) Comment: height measured without shoes  Wt 164 lb (74.4 kg)   BMI 24.04 kg/m   Constitutional:   Pleasant Caucasian male appears to be in NAD, Well developed, Well nourished, alert and cooperative Head:  Normocephalic and atraumatic. Eyes:   PEERL, EOMI. No icterus. Conjunctiva pink. Ears:  Normal auditory acuity. Neck:  Supple Throat: Oral cavity and pharynx without inflammation, swelling or lesion.  Respiratory: Respirations even and unlabored. Lungs clear to auscultation bilaterally.   No wheezes, crackles, or rhonchi.  Cardiovascular: Normal S1, S2. No MRG. Regular rate and rhythm. No peripheral edema, cyanosis or pallor.  Gastrointestinal:  Soft, nondistended, nontender. No rebound or guarding. Normal bowel sounds. No appreciable masses or hepatomegaly. Rectal:  Not performed.  Msk:   Symmetrical without gross deformities. Without edema, no deformity or joint abnormality.  Neurologic:  Alert and  oriented x4;  grossly normal neurologically.  Skin:   Dry and intact without significant lesions or rashes. Psychiatric:  Demonstrates good judgement and reason without abnormal affect or behaviors.  See HPI for recent labs.  Assessment: 1.  Upper abdominal pain: For the past 4 weeks, some better on omeprazole 20 mg twice daily, no real pain over the past 4 days, H. pylori blood testing negative; consider gastritis versus gallbladder etiology versus other 2.  GERD: Occasional symptoms which may  last a month at a time, typically related to the patient's diet, none recently  Plan: 1.  Scheduled patient for an EGD in the [place] with Dr. Henrene Pastor.  Did discuss risks, benefits, limitations and alternatives and patient agrees to proceed.  He will be Covid tested 2 days prior to time of procedure. 2.  Recommend the patient continue his Omeprazole 20 mg twice daily for now.  This may need to be increased after EGD. 3.  Discussed with the patient that he could cancel his abdominal ultrasound at the moment if he would like to, if EGD is unrevealing we can consider reordering this for evaluation of his gallbladder.  Or he can keep this as scheduled. 4.  Patient to follow in clinic for recommendations from Dr. Henrene Pastor after time of procedure.  Ellouise Newer, PA-C [facility] 07/09/2019, 2:56 PM  Cc: Shirline Frees, MD

## 2019-07-09 NOTE — Patient Instructions (Addendum)
If you are age 33 or older, your body mass index should be between 23-30. Your Body mass index is 24.04 kg/m. If this is out of the aforementioned range listed, please consider follow up with your Primary Care Provider.  If you are age 38 or younger, your body mass index should be between 19-25. Your Body mass index is 24.04 kg/m. If this is out of the aformentioned range listed, please consider follow up with your Primary Care Provider.  __________________________________________________________________  Continue Omeprazole 20 mg twice daily.  __________________________________________________________________  Bonita Quin have been scheduled for an endoscopy. Please follow written instructions given to you at your visit today. If you use inhalers (even only as needed), please bring them with you on the day of your procedure.  __________________________________________________________________  Due to recent changes in healthcare laws, you may see the results of your imaging and laboratory studies on MyChart before your provider has had a chance to review them.  We understand that in some cases there may be results that are confusing or concerning to you. Not all laboratory results come back in the same time frame and the provider may be waiting for multiple results in order to interpret others.  Please give [place] 48 hours in order for your provider to thoroughly review all the results before contacting the office for clarification of your results.

## 2019-07-09 NOTE — Progress Notes (Signed)
Assessment and plan reviewed 

## 2019-07-09 NOTE — Addendum Note (Signed)
Addended by: Mariane Duval on: 07/09/2019 03:46 PM   Modules accepted: Orders

## 2019-07-12 ENCOUNTER — Other Ambulatory Visit: Payer: No Typology Code available for payment source

## 2019-07-23 MED FILL — PANTOPRAZOLE SOD DR 40 MG T: 40 | 90 days supply | Qty: 180 | Fill #0

## 2019-07-24 ENCOUNTER — Ambulatory Visit: Payer: No Typology Code available for payment source | Admitting: Gastroenterology

## 2019-08-09 ENCOUNTER — Other Ambulatory Visit: Payer: Self-pay | Admitting: Internal Medicine

## 2019-08-09 ENCOUNTER — Ambulatory Visit (INDEPENDENT_AMBULATORY_CARE_PROVIDER_SITE_OTHER): Payer: No Typology Code available for payment source

## 2019-08-09 DIAGNOSIS — Z1159 Encounter for screening for other viral diseases: Secondary | ICD-10-CM

## 2019-08-09 LAB — SARS CORONAVIRUS 2 (TAT 6-24 HRS): SARS Coronavirus 2: NEGATIVE

## 2019-08-10 ENCOUNTER — Encounter: Payer: Self-pay | Admitting: Internal Medicine

## 2019-08-13 ENCOUNTER — Ambulatory Visit (AMBULATORY_SURGERY_CENTER): Payer: No Typology Code available for payment source | Admitting: Internal Medicine

## 2019-08-13 ENCOUNTER — Other Ambulatory Visit: Payer: Self-pay

## 2019-08-13 ENCOUNTER — Encounter: Payer: Self-pay | Admitting: Internal Medicine

## 2019-08-13 VITALS — BP 109/56 | HR 59 | Temp 96.2°F | Resp 14 | Ht 70.0 in | Wt 164.0 lb

## 2019-08-13 DIAGNOSIS — K2970 Gastritis, unspecified, without bleeding: Secondary | ICD-10-CM

## 2019-08-13 DIAGNOSIS — K2950 Unspecified chronic gastritis without bleeding: Secondary | ICD-10-CM | POA: Diagnosis not present

## 2019-08-13 DIAGNOSIS — K219 Gastro-esophageal reflux disease without esophagitis: Secondary | ICD-10-CM

## 2019-08-13 DIAGNOSIS — R1013 Epigastric pain: Secondary | ICD-10-CM

## 2019-08-13 MED ORDER — SODIUM CHLORIDE 0.9 % IV SOLN: 500.0000 mL | Freq: Once | INTRAVENOUS | Status: DC

## 2019-08-13 NOTE — Progress Notes (Signed)
Pt's states no medical or surgical changes since previsit or office visit.  WR- VS

## 2019-08-13 NOTE — Progress Notes (Signed)
Called to room to assist during endoscopic procedure.  Patient ID and intended procedure confirmed with present staff. Received instructions for my participation in the procedure from the performing physician.  

## 2019-08-13 NOTE — Patient Instructions (Signed)
Reflux precautions- see handout.  Decrease Pantoprazole to 40mg  once daily.   YOU HAD AN ENDOSCOPIC PROCEDURE TODAY AT THE [facility]:   Refer to the procedure report that was given to you for any specific questions about what was found during the examination.  If the procedure report does not answer your questions, please call your gastroenterologist to clarify.  If you requested that your care partner not be given the details of your procedure findings, then the procedure report has been included in a sealed envelope for you to review at your convenience later.  YOU SHOULD EXPECT: Some feelings of bloating in the abdomen. Passage of more gas than usual.  Walking can help get rid of the air that was put into your GI tract during the procedure and reduce the bloating. If you had a lower endoscopy (such as a colonoscopy or flexible sigmoidoscopy) you may notice spotting of blood in your stool or on the toilet paper. If you underwent a bowel prep for your procedure, you may not have a normal bowel movement for a few days.  Please Note:  You might notice some irritation and congestion in your nose or some drainage.  This is from the oxygen used during your procedure.  There is no need for concern and it should clear up in a day or so.  SYMPTOMS TO REPORT IMMEDIATELY:   Following upper endoscopy (EGD)  Vomiting of blood or coffee ground material  New chest pain or pain under the shoulder blades  Painful or persistently difficult swallowing  New shortness of breath  Fever of 100F or higher  Black, tarry-looking stools  For urgent or emergent issues, a gastroenterologist can be reached at any hour by calling [phone]. Do not use MyChart messaging for urgent concerns.    DIET:  We do recommend a small meal at first, but then you may proceed to your regular diet.  Drink plenty of fluids but you should avoid alcoholic beverages for 24 hours.  ACTIVITY:  You should plan to take  it easy for the rest of today and you should NOT DRIVE or use heavy machinery until tomorrow (because of the sedation medicines used during the test).    FOLLOW UP: Our staff will call the number listed on your records 48-72 hours following your procedure to check on you and address any questions or concerns that you may have regarding the information given to you following your procedure. If we do not reach you, we will leave a message.  We will attempt to reach you two times.  During this call, we will ask if you have developed any symptoms of COVID 19. If you develop any symptoms (ie: fever, flu-like symptoms, shortness of breath, cough etc.) before then, please call [phone].  If you test positive for Covid 19 in the 2 weeks post procedure, please call and report this information to [phone].    If any biopsies were taken you will be contacted by phone or by letter within the next 1-3 weeks.  Please call us at [phone] if you have not heard about the biopsies in 3 weeks.    SIGNATURES/CONFIDENTIALITY: You and/or your care partner have signed paperwork which will be entered into your electronic medical record.  These signatures attest to the fact that that the information above on your After Visit Summary has been reviewed and is understood.  Full responsibility of the confidentiality of this discharge information lies with you and/or your care-partner.

## 2019-08-13 NOTE — Op Note (Signed)
[facility] Patient Name: Alvin Johnson Procedure Date: 08/13/2019 4:11 PM MRN: [id number] Endoscopist: Wilhemina Bonito. Marina Goodell , MD Age: 33 Referring MD:  Date of Birth: 1985-05-14 Gender: Male Account #: [id number] Procedure:                Upper GI endoscopy with biopsies Indications:              Upper abdominal pain, Suspected esophageal reflux.                            Symptoms improved on PPI. Currently on pantoprazole                            40 mg twice daily Medicines:                Monitored Anesthesia Care Procedure:                Pre-Anesthesia Assessment:                           - Prior to the procedure, a History and Physical                            was performed, and patient medications and                            allergies were reviewed. The patient's tolerance of                            previous anesthesia was also reviewed. The risks                            and benefits of the procedure and the sedation                            options and risks were discussed with the patient.                            All questions were answered, and informed consent                            was obtained. Prior Anticoagulants: The patient has                            taken no previous anticoagulant or antiplatelet                            agents. ASA Grade Assessment: I - A normal, healthy                            patient. After reviewing the risks and benefits,                            the patient was deemed in satisfactory condition to  undergo the procedure.                           After obtaining informed consent, the endoscope was                            passed under direct vision. Throughout the                            procedure, the patient's blood pressure, pulse, and                            oxygen saturations were monitored continuously. The                            Endoscope was introduced through the  mouth, and                            advanced to the second part of duodenum. The upper                            GI endoscopy was accomplished without difficulty.                            The patient tolerated the procedure well. Scope In: Scope Out: Findings:                 The esophagus was normal.                           The stomach was normal. Biopsies were taken with a                            cold forceps for histology, to rule out                            Helicobacter pylori.                           The examined duodenum was normal.                           The cardia and gastric fundus were normal on                            retroflexion. Complications:            No immediate complications. Estimated Blood Loss:     Estimated blood loss: none. Impression:               1. Normal EGD                           2. Status post biopsies to rule out Helicobacter                            pylori  3. GERD and upper abdominal pain, improved on PPI. Recommendation:           - Patient has a contact number available for                            emergencies. The signs and symptoms of potential                            delayed complications were discussed with the                            patient. Return to normal activities tomorrow.                            Written discharge instructions were provided to the                            patient.                           - Reflux Precautions. Nursing will provide you with                            this information.                           - Decrease Pantoprazole to 40 mg Once Daily.                           - Await pathology results.                           - Routine office follow-up with Dr. Marina Goodell in about                            6 weeks Wilhemina Bonito. Marina Goodell, MD 08/13/2019 4:32:32 PM This report has been signed electronically.

## 2019-08-13 NOTE — Progress Notes (Signed)
PT taken to PACU. Monitors in place. VSS. Report given to RN. 

## 2019-08-15 ENCOUNTER — Telehealth: Payer: Self-pay | Admitting: *Deleted

## 2019-08-15 NOTE — Telephone Encounter (Signed)
  Follow up Call-  Call back number 08/13/2019  Post procedure Call Back phone  # [phone]  Permission to leave phone message Yes  Some recent data might be hidden     Patient questions:  Do you have a fever, pain , or abdominal swelling? No. Pain Score  1 *  Have you tolerated food without any problems? Yes.    Have you been able to return to your normal activities? Yes.    Do you have any questions about your discharge instructions: Diet   No. Medications  No. Follow up visit  No.  Do you have questions or concerns about your Care? No.  Actions: * If pain score is 4 or above: 1. No action needed, pain <4.Have you developed a fever since your procedure? no  2.   Have you had an respiratory symptoms (SOB or cough) since your procedure? no  3.   Have you tested positive for COVID 19 since your procedure no  4.   Have you had any family members/close contacts diagnosed with the COVID 19 since your procedure?  no   If yes to any of these questions please route to Laverna Peace, RN and Charlett Lango, RN

## 2019-08-16 ENCOUNTER — Encounter: Payer: Self-pay | Admitting: Internal Medicine

## 2019-10-03 ENCOUNTER — Ambulatory Visit: Payer: No Typology Code available for payment source | Admitting: Internal Medicine

## 2019-10-22 ENCOUNTER — Other Ambulatory Visit (HOSPITAL_COMMUNITY): Payer: Self-pay | Admitting: Physician Assistant

## 2019-10-22 MED FILL — MELOXICAM 15 MG TABLET: 15 | 30 days supply | Qty: 30 | Fill #0

## 2019-10-30 ENCOUNTER — Other Ambulatory Visit: Payer: Self-pay | Admitting: Physician Assistant

## 2019-10-30 DIAGNOSIS — M25551 Pain in right hip: Secondary | ICD-10-CM

## 2019-10-31 ENCOUNTER — Other Ambulatory Visit (HOSPITAL_COMMUNITY): Payer: Self-pay | Admitting: Physician Assistant

## 2019-10-31 MED FILL — predniSONE 20 MG TABS: 20 | 9 days supply | Qty: 18 | Fill #0

## 2019-11-18 ENCOUNTER — Ambulatory Visit
Admission: RE | Admit: 2019-11-18 | Discharge: 2019-11-18 | Disposition: A | Discharge status: Home / Self Care | Payer: No Typology Code available for payment source | Source: Ambulatory Visit | Attending: Physician Assistant | Admitting: Physician Assistant

## 2019-11-18 DIAGNOSIS — M25551 Pain in right hip: Secondary | ICD-10-CM

## 2019-11-29 ENCOUNTER — Ambulatory Visit: Payer: No Typology Code available for payment source | Attending: Orthopedic Surgery

## 2019-11-29 ENCOUNTER — Other Ambulatory Visit: Payer: Self-pay

## 2019-11-29 DIAGNOSIS — M25551 Pain in right hip: Secondary | ICD-10-CM | POA: Diagnosis present

## 2019-11-29 DIAGNOSIS — G8929 Other chronic pain: Secondary | ICD-10-CM | POA: Insufficient documentation

## 2019-11-29 DIAGNOSIS — S73191D Other sprain of right hip, subsequent encounter: Secondary | ICD-10-CM | POA: Diagnosis not present

## 2019-11-29 DIAGNOSIS — M6281 Muscle weakness (generalized): Secondary | ICD-10-CM | POA: Diagnosis present

## 2019-11-30 NOTE — Therapy (Signed)
[facility] Outpatient Rehabilitation [facility] [address] [place], [place], [address] Phone: [phone]   Fax:  [phone]  Physical Therapy Evaluation  Patient Details  Name: Alvin Johnson MRN: [id number] Date of Birth: [birth year] Referring Provider (PT): Yolonda Kida, MD   Encounter Date: 11/29/2019   PT End of Session - 11/30/19 0657    Visit Number 1    Number of Visits 17    Date for PT Re-Evaluation 02/02/20    Authorization Type [place] [employer]OC[employer]    Progress Note Due on Visit 10    PT Start Time 1218    PT Stop Time 1310    PT Time Calculation (min) 52 min    Activity Tolerance Patient tolerated treatment well    Behavior During Therapy [facility] for tasks assessed/performed           Past Medical History:  Diagnosis Date  . GERD (gastroesophageal reflux disease)     Past Surgical History:  Procedure Laterality Date  . KNEE ARTHROSCOPY Left   . VASECTOMY    . WISDOM TOOTH EXTRACTION      There were no vitals filed for this visit.    Subjective Assessment - 11/29/19 1233    Subjective MOI: [place] and was knocked of balance causing him to land on his feet awkwardly. Pt states he felt a jamming sensation of his hips. Currently, pt is experiencing some popping sensations and intermittent minimal pain of the Rhip with extended time on his feet, quick movements, and bending at hips. 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 Pt expressed the he is experiencing some issues with his L hip as well.    Limitations Walking;Standing    Diagnostic tests Anterior superior labral tear    Patient Stated Goals To run occasionally, to lift heavy amounts as needed for houshold activities and for an upcoming move.    Currently in Pain? Yes    Pain Score 1     Pain Location Hip   groin   Pain Orientation Right;Medial;Lateral    Pain Descriptors / Indicators Dull;Aching    Pain Type Chronic pain     Pain Onset More than a month ago    Pain Frequency Intermittent    Aggravating Factors  entended time on feet, quick movements, bending at hips    Pain Relieving Factors Rest, changing positions    Effect of Pain on Daily Activities Minimal cmanage activity              [facility] PT Assessment - 11/30/19 0001      Assessment   Medical Diagnosis Other articular cartilage disorders, right hip    Referring Provider (PT) Yolonda Kida, MD    Hand Dominance Right    Next MD Visit 12/04/19    Prior Therapy No      Precautions   Precautions None      Restrictions   Weight Bearing Restrictions Yes      Balance Screen   Has the patient fallen in the past 6 months No      Home Environment   Living Environment Private residence    Living Arrangements Spouse/significant other;Children    Type of Home House    Home Access Stairs to enter    Entrance Stairs-Number of Steps 15    Entrance Stairs-Rails None    Home Layout Two level    Alternate Level Stairs-Number of Steps 14  Alternate Level Stairs-Rails None      Prior Function   Level of Independence Independent    Vocation Full time employment    Programmer, multimedia      Cognition   Overall Cognitive Status Within Functional Limits for tasks assessed      Observation/Other Assessments   Focus on Therapeutic Outcomes (FOTO)  63% ability      Sensation   Light Touch Appears Intact      Posture/Postural Control   Posture/Postural Control No significant limitations      ROM / Strength   AROM / PROM / Strength AROM;Strength      AROM   Overall AROM Comments R and L hip ROMs are equal to each other..pt reports discomfort at end range of flexion and add.    AROM Assessment Site Hip    Right/Left Hip Right;Left    Right Hip Extension --   NT   Right Hip Flexion 128    Right Hip External Rotation  35    Right Hip Internal Rotation  34    Right Hip ABduction 30    Left Hip Extension --   NT    Left Hip Flexion 130    Left Hip External Rotation  36    Left Hip Internal Rotation  35    Left Hip ABduction 30      Strength   Overall Strength Comments Pain with resisted hip flexion    Strength Assessment Site Hip    Right/Left Hip Right    Right Hip Flexion 5/5    Right Hip Extension 5/5    Right Hip External Rotation  4/5    Right Hip Internal Rotation 5/5    Right Hip ABduction 4/5    Right Hip ADduction 5/5      Transfers   Transfers Sit to Stand;Stand to Sit    Sit to Stand 7: Independent   appropriate quality     Ambulation/Gait   Ambulation/Gait Yes    Ambulation/Gait Assistance 7: Independent    Gait Pattern Within Functional Limits;Step-to pattern   appropriate quailty, no antalgic patttern                     Objective measurements completed on examination: See above findings.               PT Education - 11/30/19 0655    Education Details Eval findings, POC, HEP-with pain free movements    Person(s) Educated Patient    Methods Explanation;Demonstration;Tactile cues;Verbal cues;Handout    Comprehension Verbalized understanding;Returned demonstration;Verbal cues required;Tactile cues required            PT Short Term Goals - 11/30/19 0724      PT SHORT TERM GOAL #1   Title Pt will be ind in an initial HEP    Status New    Target Date 12/21/19      PT SHORT TERM GOAL #2   Title Pt will voice understanding od measures to reduce and management R hip pain    Status New    Target Date 12/21/19             PT Long Term Goals - 11/30/19 0726      PT LONG TERM GOAL #1   Title Pt will be Ind in a final HEP to maintain or progress achieved LOF    Status New    Target Date 02/02/20      PT LONG TERM  GOAL #2   Title Pt will report 1/10 pain for the R hip with usual work duties    Status New    Target Date 02/02/20      PT LONG TERM GOAL #3   Title Pt will demonstrate 5/5 strength for R hip abd and ER    Baseline 4/5     Status New    Target Date 02/02/20      PT LONG TERM GOAL #4   Title Pt will be able to lift, push, pull at least 50 lbs repetitively with a pain report for the R hip not exceeding 1/10    Status New    Target Date 02/02/20      PT LONG TERM GOAL #5   Title Pt will be able to jog 10 mins with a pain report for the R hip not exceeding 1/10    Status New    Target Date 02/02/20                  Plan - 11/30/19 0659    Clinical Impression Statement Pt presents with a R anterior superior labral tear which occured 10/13/19. Pt is to undergo conservative care c a intra-articular injection on 12/05/19 f/b physical therapy for 8 weeks as tolerated. R hip ROMs wear found WNLS and equal to the L. Weakness was noted for R hip abd and ER.Pt was started on an initial HEP for strengthening. Pt tolerated each ex well with a mild increase in R hip pain after completion of the full program. Pt is to continue with the HEP as tolerated. Pt will benefit from PT 2w8 for strengthening and ROM to maximize functional use of the R LE.    Personal Factors and Comorbidities Other    Examination-Activity Limitations Bend;Carry;Lift;Squat;Other;Locomotion Level   run   Examination-Participation Restrictions Other    Stability/Clinical Decision Making Stable/Uncomplicated    Clinical Decision Making Low    Rehab Potential Good    PT Frequency 2x / week    PT Duration 8 weeks    PT Treatment/Interventions ADLs/Self Care Home Management;Cryotherapy;Electrical Stimulation;Iontophoresis 4mg /ml Dexamethasone;Moist Heat;Neuromuscular re-education;Balance training;Therapeutic exercise;Therapeutic activities;Functional mobility training;Stair training;Gait training;Patient/family education;Manual techniques;Dry needling;Passive range of motion;Taping;Vasopneumatic Device    PT Next Visit Plan Assess response to HEP. Progress ther ex/HEP as tolerated.    PT Home Exercise Plan [facility]    Consulted and Agree with Plan of  Care Patient           Patient will benefit from skilled therapeutic intervention in order to improve the following deficits and impairments:  Decreased activity tolerance, Pain, Decreased strength  Visit Diagnosis: Tear of right acetabular labrum, subsequent encounter  Muscle weakness (generalized)  Chronic pain of right hip     Problem List There are no problems to display for this patient.   NORCAP LODGE MS, PT 11/30/19 7:37 AM  [facility] [address] [place], [place], [place] Phone: [phone]   Fax:  [phone]  Name: Alvin Johnson MRN: [name] Date of Birth: 17-Jun-1985

## 2019-12-05 ENCOUNTER — Ambulatory Visit: Payer: No Typology Code available for payment source | Admitting: Physical Therapy

## 2019-12-10 ENCOUNTER — Other Ambulatory Visit: Payer: Self-pay

## 2019-12-10 ENCOUNTER — Ambulatory Visit: Payer: No Typology Code available for payment source

## 2019-12-10 DIAGNOSIS — S73191D Other sprain of right hip, subsequent encounter: Secondary | ICD-10-CM | POA: Diagnosis not present

## 2019-12-10 DIAGNOSIS — G8929 Other chronic pain: Secondary | ICD-10-CM

## 2019-12-10 DIAGNOSIS — M6281 Muscle weakness (generalized): Secondary | ICD-10-CM

## 2019-12-10 NOTE — Therapy (Signed)
[facility] Outpatient Rehabilitation [facility] [address] [place], [place], [address] Phone: [phone]   Fax:  [phone]  Physical Therapy Treatment  Patient Details  Name: Alvin Johnson MRN: [id number] Date of Birth: Mar 11, 1985 Referring Provider (PT): Yolonda Kida, MD   Encounter Date: 12/10/2019   PT End of Session - 12/10/19 1625    Visit Number 2    Number of Visits 17    Date for PT Re-Evaluation 02/02/20    Authorization Type [place] [employer]OCUS    Progress Note Due on Visit 10    PT Start Time 1619    PT Stop Time 1659    PT Time Calculation (min) 40 min    Activity Tolerance Patient tolerated treatment well    Behavior During Therapy [facility] for tasks assessed/performed           Past Medical History:  Diagnosis Date  . GERD (gastroesophageal reflux disease)     Past Surgical History:  Procedure Laterality Date  . KNEE ARTHROSCOPY Left   . VASECTOMY    . WISDOM TOOTH EXTRACTION      There were no vitals filed for this visit.   Subjective Assessment - 12/10/19 1621    Subjective Pt reports receiving an injection last Wednesday and states his R hip pain has worsened slightly since then. Pt feels like he has been walking with a limp since last week secondary to pain. Pt explains that onset of pain/discomfort is usually following increased activity. He mentions that his primary limitations are tolerance to increased R hip flexion motion and being able to participate in sexual activity.    Limitations Walking;Standing    Diagnostic tests Anterior superior labral tear    Patient Stated Goals To run occasionally, to lift heavy amounts as needed for houshold activities and for an upcoming move.    Currently in Pain? Yes    Pain Score 2     Pain Location Hip   groin   Pain Orientation Right;Medial;Lateral;Posterior    Pain Descriptors / Indicators Aching;Dull    Pain Type Chronic pain    Pain Onset More than a month ago               [facility] PT Assessment - 12/10/19 0001      Assessment   Medical Diagnosis Other articular cartilage disorders, right hip    Referring Provider (PT) Yolonda Kida, MD                         [facility] Adult PT Treatment/Exercise - 12/10/19 0001      Self-Care   Self-Care Other Self-Care Comments    Other Self-Care Comments  Pt advised to continue with current HEP pending response to activities throughout today's session. Provided pt with green theraband for HEP due to increased ease with red theraband. Pt advised to apply ice on R hip as needed for pain relief.      Exercises   Exercises Knee/Hip      Knee/Hip Exercises: Machines for Strengthening   Cybex Knee Extension 10# x 20    Cybex Knee Flexion 20# x 20    Cybex Leg Press 35# x 25      Knee/Hip Exercises: Standing   Heel Raises Both;15 reps    Heel Raises Limitations Single leg on airex    Hip Flexion Stengthening;Right;20 reps;Knee straight    Hip Flexion Limitations freemotion 1.5#    Forward Lunges Both;15 reps  Forward Lunges Limitations within pain free range    Hip ADduction Strengthening;Right;20 reps    Hip ADduction Limitations freemotion 1.5#    Hip Abduction Stengthening;Right;20 reps;Knee straight    Abduction Limitations freemotion 1.5#    Hip Extension Stengthening;Right;20 reps;Knee straight    Extension Limitations freemotion 1.5#    Forward Step Up Both;20 reps;Step Height: 8"    Forward Step Up Limitations with opposite knee drive    Functional Squat 20 reps    Functional Squat Limitations on BOSU (black)    Wall Squat 3 sets   20 sec   Wall Squat Limitations Wall slide 3 x 20 sec within pain free range    Rocker Board Limitations Blue rocker/tilt board AP then ML x 10 each direction    SLS on BOSU (blue) 4 x 20 sec    SLS with Vectors Single leg squat with LE slide on towel with opposite leg x 15 each LE    Other Standing Knee Exercises Monster walks and lateral stepping  with green theraband proximal to knees 2 x 25 feet each                  PT Education - 12/10/19 1713    Education Details Pt advised to continue with current HEP pending response to activities throughout today's session. Provided pt with green theraband for HEP due to increased ease with red theraband. Pt advised to apply ice on R hip as needed for pain relief.    Person(s) Educated Patient    Methods Explanation;Demonstration    Comprehension Verbalized understanding;Returned demonstration            PT Short Term Goals - 11/30/19 0724      PT SHORT TERM GOAL #1   Title Pt will be ind in an initial HEP    Status New    Target Date 12/21/19      PT SHORT TERM GOAL #2   Title Pt will voice understanding od measures to reduce and management R hip pain    Status New    Target Date 12/21/19             PT Long Term Goals - 11/30/19 0726      PT LONG TERM GOAL #1   Title Pt will be Ind in a final HEP to maintain or progress achieved LOF    Status New    Target Date 02/02/20      PT LONG TERM GOAL #2   Title Pt will report 1/10 pain for the R hip with usual work duties    Status New    Target Date 02/02/20      PT LONG TERM GOAL #3   Title Pt will demonstrate 5/5 strength for R hip abd and ER    Baseline 4/5    Status New    Target Date 02/02/20      PT LONG TERM GOAL #4   Title Pt will be able to lift, push, pull at least 50 lbs repetitively with a pain report for the R hip not exceeding 1/10    Status New    Target Date 02/02/20      PT LONG TERM GOAL #5   Title Pt will be able to jog 10 mins with a pain report for the R hip not exceeding 1/10    Status New    Target Date 02/02/20  Plan - 12/10/19 1714    Clinical Impression Statement Patient tolerated progression and addition of interventions well with no complaints of significant pain or adverse effects. Pt experienced mild pain returning to upright position following wall  sits that eased once in standing position.    Personal Factors and Comorbidities Other    Examination-Activity Limitations Bend;Carry;Lift;Squat;Other;Locomotion Level   run   Examination-Participation Restrictions Other    Stability/Clinical Decision Making Stable/Uncomplicated    Rehab Potential Good    PT Frequency 2x / week    PT Duration 8 weeks    PT Treatment/Interventions ADLs/Self Care Home Management;Cryotherapy;Electrical Stimulation;Iontophoresis 4mg /ml Dexamethasone;Moist Heat;Neuromuscular re-education;Balance training;Therapeutic exercise;Therapeutic activities;Functional mobility training;Stair training;Gait training;Patient/family education;Manual techniques;Dry needling;Passive range of motion;Taping;Vasopneumatic Device    PT Next Visit Plan Assess response to HEP. Progress ther ex/HEP as tolerated.    PT Home Exercise Plan [facility]    Consulted and Agree with Plan of Care Patient           Patient will benefit from skilled therapeutic intervention in order to improve the following deficits and impairments:  Decreased activity tolerance, Pain, Decreased strength  Visit Diagnosis: Tear of right acetabular labrum, subsequent encounter  Muscle weakness (generalized)  Chronic pain of right hip     Problem List There are no problems to display for this patient.  NORCAP LODGE, PT, DPT 12/10/19 5:20 PM  [facility] Outpatient Rehabilitation [facility] [address] [place], [place], [place] Phone: [phone]   Fax:  [phone]  Name: Alvin Johnson MRN: [name] Date of Birth: 1985/04/17

## 2019-12-12 ENCOUNTER — Encounter: Payer: Self-pay | Admitting: Physical Therapy

## 2019-12-12 ENCOUNTER — Other Ambulatory Visit: Payer: Self-pay

## 2019-12-12 ENCOUNTER — Ambulatory Visit: Payer: No Typology Code available for payment source | Attending: Orthopedic Surgery | Admitting: Physical Therapy

## 2019-12-12 DIAGNOSIS — G8929 Other chronic pain: Secondary | ICD-10-CM

## 2019-12-12 DIAGNOSIS — M25551 Pain in right hip: Secondary | ICD-10-CM | POA: Insufficient documentation

## 2019-12-12 DIAGNOSIS — M25512 Pain in left shoulder: Secondary | ICD-10-CM | POA: Diagnosis present

## 2019-12-12 DIAGNOSIS — S73191D Other sprain of right hip, subsequent encounter: Secondary | ICD-10-CM

## 2019-12-12 DIAGNOSIS — M546 Pain in thoracic spine: Secondary | ICD-10-CM | POA: Insufficient documentation

## 2019-12-12 DIAGNOSIS — M6281 Muscle weakness (generalized): Secondary | ICD-10-CM

## 2019-12-12 DIAGNOSIS — M5432 Sciatica, left side: Secondary | ICD-10-CM | POA: Insufficient documentation

## 2019-12-12 NOTE — Therapy (Signed)
[facility] Outpatient Rehabilitation [facility] [address] [place], [place], [address] Phone: [phone]   Fax:  [phone]  Physical Therapy Treatment  Patient Details  Name: Alvin Johnson MRN: [id number] Date of Birth: [birth year] Referring Provider (PT): Yolonda Kida, MD   Encounter Date: 12/12/2019   PT End of Session - 12/12/19 1801    Visit Number 3    Number of Visits 17    Date for PT Re-Evaluation 02/02/20    Authorization Type [place] FOCUS    Progress Note Due on Visit 10    PT Start Time 1757    PT Stop Time 1838    PT Time Calculation (min) 41 min    Activity Tolerance Patient tolerated treatment well    Behavior During Therapy [facility] for tasks assessed/performed           Past Medical History:  Diagnosis Date  . GERD (gastroesophageal reflux disease)     Past Surgical History:  Procedure Laterality Date  . KNEE ARTHROSCOPY Left   . VASECTOMY    . WISDOM TOOTH EXTRACTION      There were no vitals filed for this visit.   Subjective Assessment - 12/12/19 1756    Subjective hip has been sore today. more anterior pain and buttock pain.    Patient Stated Goals To run occasionally, to lift heavy amounts as needed for houshold activities and for an upcoming move.    Currently in Pain? Yes    Pain Score 1     Pain Location Hip    Pain Orientation Right    Pain Descriptors / Indicators Dull              [facility] PT Assessment - 12/12/19 0001      Posture/Postural Control   Posture Comments Rt ASIS elevation in standing                         [facility] Adult PT Treatment/Exercise - 12/12/19 0001      Knee/Hip Exercises: Stretches   Piriformis Stretch Limitations contract-relax pull across & LLLD release on foam roll    Gastroc Stretch Both;30 seconds    Gastroc Stretch Limitations slant board    Other Knee/Hip Stretches hesh self correction for Lt post innominate      Knee/Hip Exercises: Aerobic    Stationary Bike 5 min L4 following DN      Knee/Hip Exercises: Seated   Other Seated Knee/Hip Exercises post pelvic tilt seated on physioball      Knee/Hip Exercises: Supine   Other Supine Knee/Hip Exercises ab set with ball squeeze    Other Supine Knee/Hip Exercises ball squeeze to TT  lift      Knee/Hip Exercises: Prone   Other Prone Exercises primal push up with ball squeeze      Manual Therapy   Manual Therapy Soft tissue mobilization    Manual therapy comments skilled palpation and monitoring during TPDN    Soft tissue mobilization Rt piriformis, glut med/min            Trigger Point Dry Needling - 12/12/19 0001    Consent Given? Yes    Education Handout Provided --   verbal education   Muscles Treated Back/Hip Gluteus minimus;Gluteus medius;Gluteus maximus;Piriformis    Other Dry Needling all on Rt    Gluteus Minimus Response Twitch response elicited;Palpable increased muscle length    Gluteus Medius Response Twitch response elicited;Palpable increased muscle length  Gluteus Maximus Response Twitch response elicited;Palpable increased muscle length    Piriformis Response Twitch response elicited;Palpable increased muscle length                PT Education - 12/12/19 1846    Education Details TPDN & expected outcomes, anatomy of condition    Person(s) Educated Patient    Methods Explanation;Demonstration;Tactile cues;Verbal cues;Handout    Comprehension Verbalized understanding;Returned demonstration;Verbal cues required;Tactile cues required;Need further instruction            PT Short Term Goals - 11/30/19 0724      PT SHORT TERM GOAL #1   Title Pt will be ind in an initial HEP    Status New    Target Date 12/21/19      PT SHORT TERM GOAL #2   Title Pt will voice understanding od measures to reduce and management R hip pain    Status New    Target Date 12/21/19             PT Long Term Goals - 11/30/19 0726      PT LONG TERM GOAL #1    Title Pt will be Ind in a final HEP to maintain or progress achieved LOF    Status New    Target Date 02/02/20      PT LONG TERM GOAL #2   Title Pt will report 1/10 pain for the R hip with usual work duties    Status New    Target Date 02/02/20      PT LONG TERM GOAL #3   Title Pt will demonstrate 5/5 strength for R hip abd and ER    Baseline 4/5    Status New    Target Date 02/02/20      PT LONG TERM GOAL #4   Title Pt will be able to lift, push, pull at least 50 lbs repetitively with a pain report for the R hip not exceeding 1/10    Status New    Target Date 02/02/20      PT LONG TERM GOAL #5   Title Pt will be able to jog 10 mins with a pain report for the R hip not exceeding 1/10    Status New    Target Date 02/02/20                 Plan - 12/12/19 1840    Clinical Impression Statement notable left post innominate rotation with Rt outflare. Addressed rotation with Hesh self correction and piriformis tightness addressed with DN which improved alignment and leg length equality. Pt reported soreness following as expected. Difficulty performing post pelvic tilt wihtout increasing rib cage flare. Added challenge of adduction and abdominal engagement to HEP and asked him to try to stand equally on his feet as well as sit with something bw his knees to activate adductors.    PT Treatment/Interventions ADLs/Self Care Home Management;Cryotherapy;Electrical Stimulation;Iontophoresis 4mg /ml Dexamethasone;Moist Heat;Neuromuscular re-education;Balance training;Therapeutic exercise;Therapeutic activities;Functional mobility training;Stair training;Gait training;Patient/family education;Manual techniques;Dry needling;Passive range of motion;Taping;Vasopneumatic Device    PT Next Visit Plan recheck rotation, DN PRN, squat form, progress core strength- planks    PT Home Exercise Plan [id number]    Consulted and Agree with Plan of Care Patient           Patient will benefit from skilled  therapeutic intervention in order to improve the following deficits and impairments:  Decreased activity tolerance, Pain, Decreased strength  Visit Diagnosis: Tear of right acetabular labrum, subsequent encounter  Muscle weakness (generalized)  Chronic pain of right hip     Problem List There are no problems to display for this patient.   Malika Demario C. Kynzlie Hilleary PT, DPT 12/12/19 6:46 PM   [facility] Outpatient Rehabilitation [facility] [address] [place], [place], [address] Phone: [phone]   Fax:  [phone]  Name: Alvin Johnson MRN: [id number] Date of Birth: 01/04/1986

## 2019-12-19 ENCOUNTER — Ambulatory Visit: Payer: No Typology Code available for payment source | Admitting: Physical Therapy

## 2019-12-19 ENCOUNTER — Other Ambulatory Visit: Payer: Self-pay

## 2019-12-19 ENCOUNTER — Encounter: Payer: Self-pay | Admitting: Physical Therapy

## 2019-12-19 DIAGNOSIS — S73191D Other sprain of right hip, subsequent encounter: Secondary | ICD-10-CM | POA: Diagnosis not present

## 2019-12-19 DIAGNOSIS — M6281 Muscle weakness (generalized): Secondary | ICD-10-CM

## 2019-12-19 NOTE — Therapy (Signed)
[facility] Outpatient Rehabilitation [facility] [address] [place], [place], [address] Phone: [phone]   Fax:  [phone]  Physical Therapy Treatment  Patient Details  Name: Alvin Johnson MRN: [id number] Date of Birth: Jul 10, 1985 Referring Provider (PT): Yolonda Kida, MD   Encounter Date: 12/19/2019   PT End of Session - 12/19/19 1504    Visit Number 4    Number of Visits 17    Date for PT Re-Evaluation 02/02/20    Authorization Type [place] FOCUS    PT Start Time 1500    PT Stop Time 1545    PT Time Calculation (min) 45 min    Activity Tolerance Patient tolerated treatment well    Behavior During Therapy [facility] for tasks assessed/performed           Past Medical History:  Diagnosis Date  . GERD (gastroesophageal reflux disease)     Past Surgical History:  Procedure Laterality Date  . KNEE ARTHROSCOPY Left   . VASECTOMY    . WISDOM TOOTH EXTRACTION      There were no vitals filed for this visit.   Subjective Assessment - 12/19/19 1500    Subjective Over the weekend added stuff in the gym with a little soreness but fine the next day.    Patient Stated Goals To run occasionally, to lift heavy amounts as needed for houshold activities and for an upcoming move.    Currently in Pain? No/denies                             [facility] Adult PT Treatment/Exercise - 12/19/19 0001      Knee/Hip Exercises: Stretches   Other Knee/Hip Stretches mod thomas for Rt hip flexors      Knee/Hip Exercises: Supine   Bridges with [employer] Both;20 reps   alt knee ext   Straight Leg Raises Limitations straight leg bridge with alt SLR    Other Supine Knee/Hip Exercises stabilizer- with marching & LE ext      Knee/Hip Exercises: Sidelying   Clams adduction clams- Lt leg on bolster with abdductors activated      Manual Therapy   Soft tissue mobilization Rt piriformis, Rt pectineus                  PT Education - 12/19/19  1726    Education Details anatomy of condition, exercise rationale    Person(s) Educated Patient    Methods Explanation;Handout    Comprehension Verbalized understanding            PT Short Term Goals - 11/30/19 0724      PT SHORT TERM GOAL #1   Title Pt will be ind in an initial HEP    Status New    Target Date 12/21/19      PT SHORT TERM GOAL #2   Title Pt will voice understanding od measures to reduce and management R hip pain    Status New    Target Date 12/21/19             PT Long Term Goals - 11/30/19 0726      PT LONG TERM GOAL #1   Title Pt will be Ind in a final HEP to maintain or progress achieved LOF    Status New    Target Date 02/02/20      PT LONG TERM GOAL #2   Title Pt will report 1/10 pain for the  R hip with usual work duties    Status New    Target Date 02/02/20      PT LONG TERM GOAL #3   Title Pt will demonstrate 5/5 strength for R hip abd and ER    Baseline 4/5    Status New    Target Date 02/02/20      PT LONG TERM GOAL #4   Title Pt will be able to lift, push, pull at least 50 lbs repetitively with a pain report for the R hip not exceeding 1/10    Status New    Target Date 02/02/20      PT LONG TERM GOAL #5   Title Pt will be able to jog 10 mins with a pain report for the R hip not exceeding 1/10    Status New    Target Date 02/02/20                 Plan - 12/19/19 1550    Clinical Impression Statement Tightness in pectineus decreased with manual therapy and exercises to target abdominal engagement with hip control. At the end of the session noted that insertion of inguinal ligament to pubic bone was concordantly point tender. Will try icing area 2/day to decrease inflammation.    PT Treatment/Interventions ADLs/Self Care Home Management;Cryotherapy;Electrical Stimulation;Iontophoresis 4mg /ml Dexamethasone;Moist Heat;Neuromuscular re-education;Balance training;Therapeutic exercise;Therapeutic activities;Functional mobility  training;Stair training;Gait training;Patient/family education;Manual techniques;Dry needling;Passive range of motion;Taping;Vasopneumatic Device    PT Next Visit Plan recheck inguinal ligament, cont to progress core stability    PT Home Exercise Plan [id number]    Consulted and Agree with Plan of Care Patient           Patient will benefit from skilled therapeutic intervention in order to improve the following deficits and impairments:  Decreased activity tolerance, Pain, Decreased strength  Visit Diagnosis: Tear of right acetabular labrum, subsequent encounter  Muscle weakness (generalized)     Problem List There are no problems to display for this patient.   Nitin Mckowen C. Tashon Capp PT, DPT 12/19/19 5:27 PM   [facility] Out[facility] [address] [place], [place], [place] Phone: [phone]   Fax:  [phone]  Name: RODERICK CALO MRN: [name] Date of Birth: [birth year]

## 2019-12-21 ENCOUNTER — Other Ambulatory Visit: Payer: Self-pay

## 2019-12-21 ENCOUNTER — Ambulatory Visit: Payer: No Typology Code available for payment source | Admitting: Physical Therapy

## 2019-12-21 ENCOUNTER — Encounter: Payer: Self-pay | Admitting: Physical Therapy

## 2019-12-21 DIAGNOSIS — G8929 Other chronic pain: Secondary | ICD-10-CM

## 2019-12-21 DIAGNOSIS — M6281 Muscle weakness (generalized): Secondary | ICD-10-CM

## 2019-12-21 DIAGNOSIS — S73191D Other sprain of right hip, subsequent encounter: Secondary | ICD-10-CM

## 2019-12-21 NOTE — Therapy (Signed)
[facility] Outpatient Rehabilitation [facility] [address] [place], [place], [address] Phone: [phone]   Fax:  [phone]  Physical Therapy Treatment  Patient Details  Name: Alvin Johnson MRN: [id number] Date of Birth: Jun 22, 1985 Referring Provider (PT): Yolonda Kida, MD   Encounter Date: 12/21/2019   PT End of Session - 12/21/19 0845    Visit Number 5    Number of Visits 17    Date for PT Re-Evaluation 02/02/20    Authorization Type [place] FOCUS    PT Start Time 0830    PT Stop Time 0915    PT Time Calculation (min) 45 min           Past Medical History:  Diagnosis Date  . GERD (gastroesophageal reflux disease)     Past Surgical History:  Procedure Laterality Date  . KNEE ARTHROSCOPY Left   . VASECTOMY    . WISDOM TOOTH EXTRACTION      There were no vitals filed for this visit.   Subjective Assessment - 12/21/19 0834    Subjective Very little pain at all.  Therapy has helped him. Want to be able to lift heavy things and jog a little.    Currently in Pain? No/denies                [facility] Adult PT Treatment/Exercise - 12/21/19 0001      Knee/Hip Exercises: Stretches   Active Hamstring Stretch Right    Active Hamstring Stretch Limitations x 3    Hip Flexor Stretch 3 reps      Knee/Hip Exercises: Aerobic   Elliptical 6 min L 1 ramp and L1 5 resistance      Knee/Hip Exercises: Standing   Hip Flexion Stengthening;Right;2 sets;15 reps;Knee bent    Hip Flexion Limitations looped band red    Extension Limitations deadlift x 15 lbs KB x 15 min cues    Lateral Step Up Both;1 set;15 reps    Functional Squat 20 reps    Functional Squat Limitations 15 lbs KB    SLS with Vectors hip abd x 15 each off step no UE      Knee/Hip Exercises: Supine   Bridges Limitations stability with blue band    Bridges with Ball Squeeze Both;1 set;20 reps    Bridges with Clamshell Both;Strengthening;20 reps   blue band   Single Leg Bridge  Both;1 set;10 reps      Knee/Hip Exercises: Prone   Other Prone Exercises quadruped hip mobility, lateral rocking and then to increase hip flexion x 5                    PT Short Term Goals - 11/30/19 0724      PT SHORT TERM GOAL #1   Title Pt will be ind in an initial HEP    Status New    Target Date 12/21/19      PT SHORT TERM GOAL #2   Title Pt will voice understanding od measures to reduce and management R hip pain    Status New    Target Date 12/21/19             PT Long Term Goals - 11/30/19 0726      PT LONG TERM GOAL #1   Title Pt will be Ind in a final HEP to maintain or progress achieved LOF    Status New    Target Date 02/02/20      PT LONG TERM GOAL #2  Title Pt will report 1/10 pain for the R hip with usual work duties    Status New    Target Date 02/02/20      PT LONG TERM GOAL #3   Title Pt will demonstrate 5/5 strength for R hip abd and ER    Baseline 4/5    Status New    Target Date 02/02/20      PT LONG TERM GOAL #4   Title Pt will be able to lift, push, pull at least 50 lbs repetitively with a pain report for the R hip not exceeding 1/10    Status New    Target Date 02/02/20      PT LONG TERM GOAL #5   Title Pt will be able to jog 10 mins with a pain report for the R hip not exceeding 1/10    Status New    Target Date 02/02/20                 Plan - 12/21/19 1018    Clinical Impression Statement Focus on exercise today to improve hip mobility and strength.  No pain increase with exercises, including squats and single leg hip hinging.  Has been icing at home and declined today.    PT Treatment/Interventions ADLs/Self Care Home Management;Cryotherapy;Electrical Stimulation;Iontophoresis 4mg /ml Dexamethasone;Moist Heat;Neuromuscular re-education;Balance training;Therapeutic exercise;Therapeutic activities;Functional mobility training;Stair training;Gait training;Patient/family education;Manual techniques;Dry needling;Passive  range of motion;Taping;Vasopneumatic Device    PT Next Visit Plan recheck inguinal ligament?, cont to progress core stability, hip flexion    PT Home Exercise Plan [id number]    Consulted and Agree with Plan of Care Patient           Patient will benefit from skilled therapeutic intervention in order to improve the following deficits and impairments:  Decreased activity tolerance,Pain,Decreased strength  Visit Diagnosis: Tear of right acetabular labrum, subsequent encounter  Muscle weakness (generalized)  Chronic pain of right hip     Problem List There are no problems to display for this patient.   Raghad Lorenz 12/21/2019, 10:28 AM  [facility] [address] [place], [place], [place] Phone: [phone]   Fax:  [phone]  Name: Alvin Johnson MRN: [name] Date of Birth: [birth year]  04/13/1985, PT 12/21/19 10:29 AM Phone: [phone] Fax: [phone]

## 2019-12-22 ENCOUNTER — Telehealth: Payer: No Typology Code available for payment source | Admitting: Physician Assistant

## 2019-12-22 ENCOUNTER — Encounter: Payer: No Typology Code available for payment source | Admitting: Physician Assistant

## 2019-12-22 DIAGNOSIS — M549 Dorsalgia, unspecified: Secondary | ICD-10-CM

## 2019-12-22 MED ORDER — BACLOFEN 10 MG PO TABS: 10.0000 mg | ORAL_TABLET | Freq: Three times a day (TID) | ORAL | 0 refills | Status: DC | PRN

## 2019-12-22 MED ORDER — NAPROXEN 500 MG PO TABS: 500.0000 mg | ORAL_TABLET | Freq: Two times a day (BID) | ORAL | 0 refills | Status: AC

## 2019-12-22 NOTE — Progress Notes (Signed)

## 2019-12-22 NOTE — Progress Notes (Signed)
Duplicate encounter.  Jarold Motto PA-C

## 2019-12-25 ENCOUNTER — Other Ambulatory Visit: Payer: Self-pay

## 2019-12-25 ENCOUNTER — Ambulatory Visit: Payer: No Typology Code available for payment source

## 2019-12-25 DIAGNOSIS — M25512 Pain in left shoulder: Secondary | ICD-10-CM

## 2019-12-25 DIAGNOSIS — M6281 Muscle weakness (generalized): Secondary | ICD-10-CM

## 2019-12-25 DIAGNOSIS — S73191D Other sprain of right hip, subsequent encounter: Secondary | ICD-10-CM

## 2019-12-25 DIAGNOSIS — M546 Pain in thoracic spine: Secondary | ICD-10-CM

## 2019-12-25 DIAGNOSIS — G8929 Other chronic pain: Secondary | ICD-10-CM

## 2019-12-25 DIAGNOSIS — M5432 Sciatica, left side: Secondary | ICD-10-CM

## 2019-12-25 NOTE — Therapy (Signed)
[facility] Outpatient Rehabilitation [facility] [address] [place], [place], [address] Phone: [phone]   Fax:  [phone]  Physical Therapy Treatment  Patient Details  Name: Alvin Johnson MRN: [id number] Date of Birth: 01-21-1985 Referring Provider (PT): Yolonda Kida, MD   Encounter Date: 12/25/2019   PT End of Session - 12/25/19 1512    Visit Number 6    Number of Visits 17    Date for PT Re-Evaluation 02/02/20    Authorization Type [place] FOCUS    Progress Note Due on Visit 10    PT Start Time 1401    PT Stop Time 1447    PT Time Calculation (min) 46 min    Activity Tolerance Patient tolerated treatment well    Behavior During Therapy [facility] for tasks assessed/performed           Past Medical History:  Diagnosis Date  . GERD (gastroesophageal reflux disease)     Past Surgical History:  Procedure Laterality Date  . KNEE ARTHROSCOPY Left   . VASECTOMY    . WISDOM TOOTH EXTRACTION      There were no vitals filed for this visit.   Subjective Assessment - 12/25/19 1504    Subjective Pt reports he is not having R hip pain today. After strengthening exs, pt reported 1-2/10 level of pain which was minimized by standing hip adductor and flexore stretches. Pt states he is tolerating work outs at [employer]    Limitations Walking;Standing    Diagnostic tests Anterior superior labral tear    Patient Stated Goals To run occasionally, to lift heavy amounts as needed for houshold activities and for an upcoming move.    Currently in Pain? Yes    Pain Score 1     Pain Location Hip    Pain Orientation Right    Pain Descriptors / Indicators Dull    Pain Type Chronic pain;Intractable pain    Pain Onset More than a month ago    Pain Frequency Intermittent    Aggravating Factors  entended time on feet, quick movements, bending at hips    Pain Relieving Factors Rest, changing positions                             [facility] Adult PT  Treatment/Exercise - 12/25/19 0001      Knee/Hip Exercises: Stretches   Other Knee/Hip Stretches hip adductor stretches, standing, 3x20 sec    Other Knee/Hip Stretches hip adducter/flexor stretch, 3x 20 sec, standing      Knee/Hip Exercises: Aerobic   Elliptical 5 min L 1 ramp and L1 5 resistance      Knee/Hip Exercises: Machines for Strengthening   Cybex Knee Extension 15#, 15x2    Cybex Knee Flexion 25#, 15x2      Knee/Hip Exercises: Standing   Hip Flexion Right;Left;2 sets;15 reps;Knee bent    Hip Flexion Limitations looped band blue    Hip Abduction Both;15 reps;4 sets    Abduction Limitations Blue Tband    Lateral Step Up Right;10 reps;Hand Hold: 0;Step Height: 6"    Functional Squat 15 reps;2 sets    Functional Squat Limitations 15 lbs KB      Knee/Hip Exercises: Supine   Bridges Limitations stability with blue band    Bridges with Ball Squeeze Both;2 sets;15 reps    Bridges with Clamshell Both;Strengthening;20 reps   blue band  PT Education - 12/25/19 1510    Education Details HEP-hip adductor and flexor stretches    Person(s) Educated Patient    Methods Explanation;Demonstration;Tactile cues;Verbal cues;Handout    Comprehension Verbalized understanding;Returned demonstration;Verbal cues required;Tactile cues required            PT Short Term Goals - 11/30/19 0724      PT SHORT TERM GOAL #1   Title Pt will be ind in an initial HEP    Status New    Target Date 12/21/19      PT SHORT TERM GOAL #2   Title Pt will voice understanding od measures to reduce and management R hip pain    Status New    Target Date 12/21/19             PT Long Term Goals - 11/30/19 0726      PT LONG TERM GOAL #1   Title Pt will be Ind in a final HEP to maintain or progress achieved LOF    Status New    Target Date 02/02/20      PT LONG TERM GOAL #2   Title Pt will report 1/10 pain for the R hip with usual work duties    Status New    Target Date  02/02/20      PT LONG TERM GOAL #3   Title Pt will demonstrate 5/5 strength for R hip abd and ER    Baseline 4/5    Status New    Target Date 02/02/20      PT LONG TERM GOAL #4   Title Pt will be able to lift, push, pull at least 50 lbs repetitively with a pain report for the R hip not exceeding 1/10    Status New    Target Date 02/02/20      PT LONG TERM GOAL #5   Title Pt will be able to jog 10 mins with a pain report for the R hip not exceeding 1/10    Status New    Target Date 02/02/20                 Plan - 12/25/19 1513    Clinical Impression Statement Pt overall is responding well to strengthening exs and increased activity with minimal intermittent R hip discomfort which is located more in the groin area. Hip adductor and hip flexor stretches in standing were completed today with pt reporting them as minimizing the grion discomfort.    Personal Factors and Comorbidities Other    Examination-Activity Limitations Bend;Carry;Lift;Squat;Other;Locomotion Level    Examination-Participation Restrictions Other    Stability/Clinical Decision Making Stable/Uncomplicated    Clinical Decision Making Low    Rehab Potential Good    PT Frequency 2x / week    PT Duration 8 weeks    PT Treatment/Interventions ADLs/Self Care Home Management;Cryotherapy;Electrical Stimulation;Iontophoresis 4mg /ml Dexamethasone;Moist Heat;Neuromuscular re-education;Balance training;Therapeutic exercise;Therapeutic activities;Functional mobility training;Stair training;Gait training;Patient/family education;Manual techniques;Dry needling;Passive range of motion;Taping;Vasopneumatic Device    PT Next Visit Plan Assess response to to hip adductor and flexor stretches    PT Home Exercise Plan [id number]    Consulted and Agree with Plan of Care Patient           Patient will benefit from skilled therapeutic intervention in order to improve the following deficits and impairments:  Decreased activity  tolerance,Pain,Decreased strength  Visit Diagnosis: Tear of right acetabular labrum, subsequent encounter  Muscle weakness (generalized)  Chronic pain of right hip  Sciatica, left side  Acute  pain of left shoulder  Pain in thoracic spine     Problem List There are no problems to display for this patient.   Joellyn Rued MS, PT 12/25/19 3:32 PM  [facility] Outpatient Rehabilitation [facility] [address] [place], [place], [address] Phone: [phone]   Fax:  [phone]  Name: Alvin Johnson MRN: [id number] Date of Birth: 11/01/1985

## 2019-12-25 NOTE — Patient Instructions (Signed)
  For hip adductor and hip flexor stretches 3 x 20 sec

## 2019-12-27 ENCOUNTER — Ambulatory Visit: Payer: No Typology Code available for payment source

## 2019-12-27 ENCOUNTER — Other Ambulatory Visit: Payer: Self-pay

## 2019-12-27 DIAGNOSIS — M6281 Muscle weakness (generalized): Secondary | ICD-10-CM

## 2019-12-27 DIAGNOSIS — S73191D Other sprain of right hip, subsequent encounter: Secondary | ICD-10-CM

## 2019-12-27 DIAGNOSIS — M25512 Pain in left shoulder: Secondary | ICD-10-CM

## 2019-12-27 DIAGNOSIS — G8929 Other chronic pain: Secondary | ICD-10-CM

## 2019-12-27 DIAGNOSIS — M5432 Sciatica, left side: Secondary | ICD-10-CM

## 2019-12-27 DIAGNOSIS — M546 Pain in thoracic spine: Secondary | ICD-10-CM

## 2019-12-28 NOTE — Therapy (Signed)
[facility] Outpatient Rehabilitation [facility] [address] [place], [place], [address] Phone: [phone]   Fax:  [phone]  Physical Therapy Treatment  Patient Details  Name: Alvin Johnson MRN: [id number] Date of Birth: 19-May-1985 Referring Provider (PT): Yolonda Kida, MD   Encounter Date: 12/27/2019   PT End of Session - 12/28/19 0724    Visit Number 7    Number of Visits 17    Date for PT Re-Evaluation 02/02/20    Authorization Type [place] FOCUS    Progress Note Due on Visit 10    PT Start Time 1745    PT Stop Time 1832    PT Time Calculation (min) 47 min    Activity Tolerance Patient tolerated treatment well    Behavior During Therapy [facility] for tasks assessed/performed           Past Medical History:  Diagnosis Date  . GERD (gastroesophageal reflux disease)     Past Surgical History:  Procedure Laterality Date  . KNEE ARTHROSCOPY Left   . VASECTOMY    . WISDOM TOOTH EXTRACTION      There were no vitals filed for this visit.   Subjective Assessment - 12/28/19 0704    Subjective Pt reports he has had a busy day alot of time on his feet and fast pace and his pain is a 1/10. Pt reports he experiences the most significant pain with sex.    Diagnostic tests Anterior superior labral tear    Patient Stated Goals To run occasionally, to lift heavy amounts as needed for houshold activities and for an upcoming move.    Currently in Pain? Yes    Pain Score 1     Pain Location Hip    Pain Orientation Right    Pain Descriptors / Indicators Dull    Pain Type Chronic pain;Intractable pain    Pain Onset More than a month ago    Pain Frequency Intermittent    Aggravating Factors  entended time on feet, quick movements, bending at hips, sex    Pain Relieving Factors Rest, changing positions    Effect of Pain on Daily Activities Minimal c manage activity                             [facility] Adult PT Treatment/Exercise -  12/28/19 0001      Exercises   Exercises Lumbar;Knee/Hip      Lumbar Exercises: Machines for Strengthening   Leg Press 15x2; 80lbs      Knee/Hip Exercises: Aerobic   Elliptical 5 min, L5; Slope:1      Knee/Hip Exercises: Standing   Knee Flexion Right;2 sets;15 reps    Knee Flexion Limitations 4 lbs    Lateral Step Up Right;Left;2 sets;15 reps;Hand Hold: 0;Step Height: 8"    Forward Step Up Right;Left;2 sets;15 reps;Hand Hold: 0;Step Height: 8"    Forward Step Up Limitations To high knee opposite leg; c reverse step off      Knee/Hip Exercises: Seated   Long Arc Quad Right;2 sets;15 reps    Long Arc Quad Weight 4 lbs.      Knee/Hip Exercises: Supine   Straight Leg Raises Right;2 sets;15 reps    Straight Leg Raises Limitations 4#      Knee/Hip Exercises: Sidelying   Hip ABduction Right;2 sets;15 reps    Hip ABduction Limitations 4#      Knee/Hip Exercises: Prone   Straight Leg Raises Right;2  sets;15 reps    Straight Leg Raises Limitations 4#    Other Prone Exercises hip ext; 8x each                  PT Education - 12/28/19 0724    Education Details HEP: abdominal and back strengthening    Person(s) Educated Patient    Methods Explanation;Demonstration;Tactile cues;Verbal cues    Comprehension Verbalized understanding;Returned demonstration;Verbal cues required;Tactile cues required            PT Short Term Goals - 11/30/19 0724      PT SHORT TERM GOAL #1   Title Pt will be ind in an initial HEP    Status New    Target Date 12/21/19      PT SHORT TERM GOAL #2   Title Pt will voice understanding od measures to reduce and management R hip pain    Status New    Target Date 12/21/19             PT Long Term Goals - 11/30/19 0726      PT LONG TERM GOAL #1   Title Pt will be Ind in a final HEP to maintain or progress achieved LOF    Status New    Target Date 02/02/20      PT LONG TERM GOAL #2   Title Pt will report 1/10 pain for the R hip with  usual work duties    Status New    Target Date 02/02/20      PT LONG TERM GOAL #3   Title Pt will demonstrate 5/5 strength for R hip abd and ER    Baseline 4/5    Status New    Target Date 02/02/20      PT LONG TERM GOAL #4   Title Pt will be able to lift, push, pull at least 50 lbs repetitively with a pain report for the R hip not exceeding 1/10    Status New    Target Date 02/02/20      PT LONG TERM GOAL #5   Title Pt will be able to jog 10 mins with a pain report for the R hip not exceeding 1/10    Status New    Target Date 02/02/20                 Plan - 12/28/19 0726    Clinical Impression Statement Pt reports he has not tried the hip flexor or hip adductors stretches yet. PT continued strengthening exs of the R LE. Back and abdominal stengthening exs were aslo completed today with pt provided copies of exs to add to his home program. Pt is tolerating PT with gradually progressive strengthening exs for his hips.    Personal Factors and Comorbidities Other    Examination-Activity Limitations Bend;Carry;Lift;Squat;Other;Locomotion Level    Examination-Participation Restrictions Other    Stability/Clinical Decision Making Stable/Uncomplicated    Clinical Decision Making Low    Rehab Potential Good    PT Frequency 2x / week    PT Duration 8 weeks    PT Treatment/Interventions ADLs/Self Care Home Management;Cryotherapy;Electrical Stimulation;Iontophoresis 4mg /ml Dexamethasone;Moist Heat;Neuromuscular re-education;Balance training;Therapeutic exercise;Therapeutic activities;Functional mobility training;Stair training;Gait training;Patient/family education;Manual techniques;Dry needling;Passive range of motion;Taping;Vasopneumatic Device    PT Next Visit Plan Assess response to to hip adductor and flexor stretches and abdominal and back strength. Will add LE stability, balance, and pylometrics the next session.    PT Home Exercise Plan [facility]    Consulted and Agree with  Plan  of Care Patient           Patient will benefit from skilled therapeutic intervention in order to improve the following deficits and impairments:  Decreased activity tolerance,Pain,Decreased strength  Visit Diagnosis: Tear of right acetabular labrum, subsequent encounter  Muscle weakness (generalized)  Chronic pain of right hip  Sciatica, left side  Acute pain of left shoulder  Pain in thoracic spine     Problem List There are no problems to display for this patient.   Joellyn Rued MS, PT 12/28/19 7:37 AM  [facility] [address] [place], [place], [address] Phone: [phone]   Fax:  [phone]  Name: Alvin Johnson MRN: [id number] Date of Birth: 09/20/1985

## 2020-01-01 ENCOUNTER — Ambulatory Visit: Payer: No Typology Code available for payment source

## 2020-01-01 ENCOUNTER — Other Ambulatory Visit: Payer: Self-pay

## 2020-01-01 DIAGNOSIS — M6281 Muscle weakness (generalized): Secondary | ICD-10-CM

## 2020-01-01 DIAGNOSIS — G8929 Other chronic pain: Secondary | ICD-10-CM

## 2020-01-01 DIAGNOSIS — S73191D Other sprain of right hip, subsequent encounter: Secondary | ICD-10-CM

## 2020-01-02 NOTE — Therapy (Signed)
[facility] [address] [place], [place], [address] Phone: [phone]   Fax:  [phone]  Physical Therapy Treatment  Patient Details  Name: Alvin Johnson MRN: [id number] Date of Birth: 04-24-1985 Referring Provider (PT): Yolonda Kida, MD   Encounter Date: 01/01/2020   PT End of Session - 01/01/20 1400    Visit Number 8    Number of Visits 17    Date for PT Re-Evaluation 02/02/20    Authorization Type [place] [employer]OCUS    Progress Note Due on Visit 10    PT Start Time 1700    PT Stop Time 1743    PT Time Calculation (min) 43 min    Activity Tolerance Patient tolerated treatment well    Behavior During Therapy [facility] for tasks assessed/performed           Past Medical History:  Diagnosis Date  . GERD (gastroesophageal reflux disease)     Past Surgical History:  Procedure Laterality Date  . KNEE ARTHROSCOPY Left   . VASECTOMY    . WISDOM TOOTH EXTRACTION      There were no vitals filed for this visit.   Subjective Assessment - 01/01/20 1406    Subjective Pt reports some pain on Saturday with no identified reason for increase. pt reports Working out on Sunday with both strengthening and flexibilty exs and his R hip felt good    Limitations Walking;Standing    Diagnostic tests Anterior superior labral tear    Patient Stated Goals To run occasionally, to lift heavy amounts as needed for houshold activities and for an upcoming move.    Currently in Pain? Yes    Pain Score 1     Pain Location Hip    Pain Orientation Right    Pain Descriptors / Indicators Dull    Pain Type Chronic pain    Pain Onset More than a month ago    Pain Frequency Intermittent                             [facility] Adult PT Treatment/Exercise - 01/02/20 0001      Exercises   Exercises Lumbar;Knee/Hip      Knee/Hip Exercises: Aerobic   Elliptical 6 min, L5; Ramp 4; forwar and back ward      Knee/Hip Exercises:  Machines for Strengthening   Cybex Knee Extension Both 30#; 15x2    Cybex Knee Flexion Both 40#, 15x2    Cybex Leg Press Both, 80#, 15x2    Other Machine L and R Hip ext and abd; 37.5, 15x2      Knee/Hip Exercises: Standing   Forward Lunges Both;20 reps   20 reps partial, 10 reps full   Other Standing Knee Exercises Hinged hip lifts, 30#, 12" height, 10x3    Other Standing Knee Exercises Farmer's lift 15#, 400 ft                    PT Short Term Goals - 11/30/19 0724      PT SHORT TERM GOAL #1   Title Pt will be ind in an initial HEP    Status New    Target Date 12/21/19      PT SHORT TERM GOAL #2   Title Pt will voice understanding od measures to reduce and management R hip pain    Status New    Target Date 12/21/19  PT Long Term Goals - 11/30/19 0726      PT LONG TERM GOAL #1   Title Pt will be Ind in a final HEP to maintain or progress achieved LOF    Status New    Target Date 02/02/20      PT LONG TERM GOAL #2   Title Pt will report 1/10 pain for the R hip with usual work duties    Status New    Target Date 02/02/20      PT LONG TERM GOAL #3   Title Pt will demonstrate 5/5 strength for R hip abd and ER    Baseline 4/5    Status New    Target Date 02/02/20      PT LONG TERM GOAL #4   Title Pt will be able to lift, push, pull at least 50 lbs repetitively with a pain report for the R hip not exceeding 1/10    Status New    Target Date 02/02/20      PT LONG TERM GOAL #5   Title Pt will be able to jog 10 mins with a pain report for the R hip not exceeding 1/10    Status New    Target Date 02/02/20                 Plan - 01/01/20 1401    Clinical Impression Statement PT was completed for LE and trunk strengthening in both the open and closed kinetic chains. Pt tolerated  progression of exercises with increased physical demand and increased functional ROM for R hip into flexion with full forward lunges.    Personal Factors and  Comorbidities Other;Comorbidity 1    Examination-Activity Limitations Bend;Carry;Lift;Squat;Other;Locomotion Level    Examination-Participation Restrictions Other    Clinical Decision Making Low    Rehab Potential Good    PT Frequency 2x / week    PT Duration 8 weeks    PT Treatment/Interventions ADLs/Self Care Home Management;Cryotherapy;Electrical Stimulation;Iontophoresis 4mg /ml Dexamethasone;Moist Heat;Neuromuscular re-education;Balance training;Therapeutic exercise;Therapeutic activities;Functional mobility training;Stair training;Gait training;Patient/family education;Manual techniques;Dry needling;Passive range of motion;Taping;Vasopneumatic Device    PT Next Visit Plan Assess response to increase with strengthening exs in both the open and closed kinetic chain.    PT Home Exercise Plan [facility]    Consulted and Agree with Plan of Care Patient           Patient will benefit from skilled therapeutic intervention in order to improve the following deficits and impairments:  Decreased activity tolerance,Pain,Decreased strength  Visit Diagnosis: Tear of right acetabular labrum, subsequent encounter  Muscle weakness (generalized)  Chronic pain of right hip     Problem List There are no problems to display for this patient.   NORCAP LODGE MS, PT 01/02/20 7:40 AM  [facility] [address] [place], [place], [place] Phone: [phone]   Fax:  [phone]  Name: Alvin Johnson MRN: [name] Date of Birth: 30-Jul-1985

## 2020-01-03 ENCOUNTER — Ambulatory Visit: Payer: No Typology Code available for payment source | Admitting: Physical Therapy

## 2020-01-03 ENCOUNTER — Other Ambulatory Visit: Payer: Self-pay

## 2020-01-03 ENCOUNTER — Encounter: Payer: Self-pay | Admitting: Physical Therapy

## 2020-01-03 DIAGNOSIS — M6281 Muscle weakness (generalized): Secondary | ICD-10-CM

## 2020-01-03 DIAGNOSIS — S73191D Other sprain of right hip, subsequent encounter: Secondary | ICD-10-CM | POA: Diagnosis not present

## 2020-01-03 DIAGNOSIS — M25551 Pain in right hip: Secondary | ICD-10-CM

## 2020-01-03 NOTE — Therapy (Signed)
[place], [place], [address] Phone: [phone]   Fax:  [phone]  Physical Therapy Treatment  Patient Details  Name: Alvin Johnson MRN: [id number] Date of Birth: 12-05-1985 Referring Provider (PT): Nicholes Stairs, MD   Encounter Date: 01/03/2020   PT End of Session - 01/03/20 1417    Visit Number 9    Number of Visits 17    Date for PT Re-Evaluation 02/02/20    Authorization Type [place] FOCUS    Progress Note Due on Visit 10    PT Start Time 1415    PT Stop Time 1456    PT Time Calculation (min) 41 min    Activity Tolerance Patient tolerated treatment well    Behavior During Therapy [facility] for tasks assessed/performed           Past Medical History:  Diagnosis Date   GERD (gastroesophageal reflux disease)     Past Surgical History:  Procedure Laterality Date   KNEE ARTHROSCOPY Left    VASECTOMY     WISDOM TOOTH EXTRACTION      There were no vitals filed for this visit.   Subjective Assessment - 01/03/20 1417    Subjective Overall I feel like it is getting better. I am able to lift floor peddle at work without pain.    Patient Stated Goals To run occasionally, to lift heavy amounts as needed for houshold activities and for an upcoming move.    Currently in Pain? No/denies              [facility] PT Assessment - 01/03/20 0001      Strength   Right Hip Flexion 5/5    Right Hip ABduction 4+/5    Left Hip Extension 4+/5                         [facility] Adult PT Treatment/Exercise - 01/03/20 0001      Pilates   Pilates Reformer foot work 2R1B, bridges1R1B      Knee/Hip Exercises: Stretches   Passive Hamstring Stretch Limitations supine with strap    Hip Flexor Stretch Limitations mod thomas test      Knee/Hip Exercises: Aerobic   Elliptical jogging, 5 min      Manual Therapy   Soft tissue mobilization Rt TFL & glut med                  PT  Education - 01/03/20 1621    Education Details reviewed running videos on ipad    Person(s) Educated Patient    Methods Explanation    Comprehension Verbalized understanding;Need further instruction            PT Short Term Goals - 01/03/20 1619      PT SHORT TERM GOAL #1   Title Pt will be ind in an initial HEP    Status Achieved      PT SHORT TERM GOAL #2   Title Pt will voice understanding od measures to reduce and management R hip pain    Baseline doing HEP which is helping to reduce pain overall, difficulty adjusting for in the moment when it hurts.    Status Partially Met             PT Long Term Goals - 11/30/19 0726      PT LONG TERM GOAL #1   Title Pt will be Ind in a final HEP to  maintain or progress achieved LOF    Status New    Target Date 02/02/20      PT LONG TERM GOAL #2   Title Pt will report 1/10 pain for the R hip with usual work duties    Status New    Target Date 02/02/20      PT LONG TERM GOAL #3   Title Pt will demonstrate 5/5 strength for R hip abd and ER    Baseline 4/5    Status New    Target Date 02/02/20      PT LONG TERM GOAL #4   Title Pt will be able to lift, push, pull at least 50 lbs repetitively with a pain report for the R hip not exceeding 1/10    Status New    Target Date 02/02/20      PT LONG TERM GOAL #5   Title Pt will be able to jog 10 mins with a pain report for the R hip not exceeding 1/10    Status New    Target Date 02/02/20                 Plan - 01/03/20 1616    Clinical Impression Statement Analyzed running pattern on treadmil today with slo-motion video. Noted mild heel whip on Rt with lateral to medial foot strike. Noted that Rt hip demo less extension when running than Lt. Placed pt on reformer for increased hip extensor and hamstring strength challenge- pt denied increased pain in hip.    PT Treatment/Interventions ADLs/Self Care Home Management;Cryotherapy;Electrical Stimulation;Iontophoresis 68m/ml  Dexamethasone;Moist Heat;Neuromuscular re-education;Balance training;Therapeutic exercise;Therapeutic activities;Functional mobility training;Stair training;Gait training;Patient/family education;Manual techniques;Dry needling;Passive range of motion;Taping;Vasopneumatic Device    PT Next Visit Plan ipad to video running on solid surface outside (if weather permits), progress reformer, check ankle mobility &challenge CKC stability    PT Home Exercise Plan [id number]    Consulted and Agree with Plan of Care Patient           Patient will benefit from skilled therapeutic intervention in order to improve the following deficits and impairments:  Decreased activity tolerance,Pain,Decreased strength  Visit Diagnosis: Tear of right acetabular labrum, subsequent encounter  Muscle weakness (generalized)  Chronic pain of right hip     Problem List There are no problems to display for this patient.   Sherrise Liberto C. Aarya Quebedeaux PT, DPT 01/03/20 4:22 PM   [facility] [address]Phone: [phone]  Fax:  [phone] Name: Alvin VOSSLERMRN: [id number] of Birth: 309-15-1987

## 2020-01-08 ENCOUNTER — Other Ambulatory Visit: Payer: Self-pay

## 2020-01-08 ENCOUNTER — Ambulatory Visit: Payer: No Typology Code available for payment source

## 2020-01-08 DIAGNOSIS — S73191D Other sprain of right hip, subsequent encounter: Secondary | ICD-10-CM

## 2020-01-08 DIAGNOSIS — M6281 Muscle weakness (generalized): Secondary | ICD-10-CM

## 2020-01-08 DIAGNOSIS — M25551 Pain in right hip: Secondary | ICD-10-CM

## 2020-01-09 NOTE — Therapy (Signed)
[place], [place], [address] Phone: [phone]   Fax:  [phone]  Physical Therapy Treatment/Progress Note   Patient Details  Name: Alvin Johnson MRN: [id number] Date of Birth: 28-Jan-1985 Referring Provider (PT): Nicholes Stairs, MD  Progress Note Reporting Period 11/29/19 to 01/08/20  See note below for Objective Data and Assessment of Progress/Goals.    Encounter Date: 01/08/2020   PT End of Session - 01/08/20 1715    Visit Number 10    Number of Visits 17    Date for PT Re-Evaluation 02/02/20    Authorization Type [place] FOCUS    Progress Note Due on Visit 10    PT Start Time 1507    PT Stop Time 1548    PT Time Calculation (min) 41 min    Activity Tolerance Patient tolerated treatment well    Behavior During Therapy [facility] for tasks assessed/performed           Past Medical History:  Diagnosis Date   GERD (gastroesophageal reflux disease)     Past Surgical History:  Procedure Laterality Date   KNEE ARTHROSCOPY Left    VASECTOMY     WISDOM TOOTH EXTRACTION      There were no vitals filed for this visit.   Subjective Assessment - 01/09/20 0710    Subjective Last Sunday i worked out and then went hunting.I didnt have any issue that day, butthe next I felt anterior, central pelvic pain at a low level of 1/10.    Limitations Walking;Standing    Diagnostic tests Anterior superior labral tear    Patient Stated Goals To run occasionally, to lift heavy amounts as needed for houshold activities and for an upcoming move.    Currently in Pain? Yes    Pain Score 1     Pain Location Hip   pelvic   Pain Orientation Right    Pain Descriptors / Indicators Dull;Burning    Pain Type Chronic pain    Pain Onset More than a month ago    Pain Frequency Intermittent    Aggravating Factors  entended time on feet, quick movements, bending at hips, sex    Pain Relieving Factors Rest,  changing positions    Effect of Pain on Daily Activities Minimal c manage activity    Multiple Pain Sites No                             [facility] Adult PT Treatment/Exercise - 01/09/20 0001      Exercises   Exercises Lumbar;Knee/Hip      Knee/Hip Exercises: Stretches   Hip Flexor Stretch Limitations mod thomas test; 3x 30 sec      Knee/Hip Exercises: Aerobic   Elliptical 5 min, L5; Ramp 5; forwar and backward    Tread Mill Jogging; 5 min      Knee/Hip Exercises: Standing   Lateral Step Up Right;Left;2 sets;10 reps;Hand Hold: 0;Step Height: 8"    Wall Squat 2 sets;15 reps    Wall Squat Limitations 25#    SLS With forward hip hinge; L and R; 15#; 10x2 each    Other Standing Knee Exercises Banded LE exs: side steps upright tunk; side steps athletic posture; monster steps; eack 20 ft x 4               Balance Exercises - 01/09/20 0001      Balance Exercises: Standing  Other Standing Exercises High knee with opposite LE suport on toes (PF)               PT Short Term Goals - 01/03/20 1619      PT SHORT TERM GOAL #1   Title Pt will be ind in an initial HEP    Status Achieved      PT SHORT TERM GOAL #2   Title Pt will voice understanding od measures to reduce and management R hip pain    Baseline doing HEP which is helping to reduce pain overall, difficulty adjusting for in the moment when it hurts.    Status Partially Met             PT Long Term Goals - 01/09/20 0732      PT LONG TERM GOAL #1   Title Pt will be Ind in a final HEP to maintain or progress achieved LOF. 01/08/20: On-going. Pt's report of pain with work is consistently 0-1/10    Status On-going    Target Date 02/02/20      PT LONG TERM GOAL #5   Title Pt will be able to jog 10 mins with a pain report for the R hip not exceeding 1/10. 01/07/30: On-going-Pt is tolerating 10 mins, 5 min on the eliptical and 5 min on the TM with pain at 1/10.    Status On-going                  Plan - 01/09/20 0715    Clinical Impression Statement Pt demonstrated improved R LE strength with better control with eccentric lowering following lateral step up withan 8" box. WIth dynamic balance, high knee stepping c opposite LE support of toes (PF), pt demonstrated good balance. However with SL hinged hip lifts ( which requires strength/stability during a slower movement pattern), pt needs opposite hand assist occasionally to maintain balance. Pt has progressed to aerobic activities, eliptical and TM-jogging, for 10 mins with pain level of 1/10. Pt is progressing appropriately toward set goals.    Personal Factors and Comorbidities Other;Comorbidity 1    Examination-Activity Limitations Bend;Carry;Lift;Squat;Other;Locomotion Level    Examination-Participation Restrictions Other    Stability/Clinical Decision Making Stable/Uncomplicated    Clinical Decision Making Low    Rehab Potential Good    PT Frequency 2x / week    PT Duration 8 weeks    PT Treatment/Interventions ADLs/Self Care Home Management;Cryotherapy;Electrical Stimulation;Iontophoresis 72m/ml Dexamethasone;Moist Heat;Neuromuscular re-education;Balance training;Therapeutic exercise;Therapeutic activities;Functional mobility training;Stair training;Gait training;Patient/family education;Manual techniques;Dry needling;Passive range of motion;Taping;Vasopneumatic Device    PT Next Visit Plan ipad to video running on solid surface outside (if weather permits), progress reformer, check ankle mobility &challenge CKC stability    PT Home Exercise Plan [id number]    Consulted and Agree with Plan of Care Patient           Patient will benefit from skilled therapeutic intervention in order to improve the following deficits and impairments:  Decreased activity tolerance,Pain,Decreased strength  Visit Diagnosis: Tear of right acetabular labrum, subsequent encounter  Muscle weakness (generalized)  Chronic pain of right  hip     Problem List There are no problems to display for this patient.   AGar Ponto12/29/2021, 7:36 AM  [facility] [address]Phone: [phone]  Fax:  [phone] Name: Alvin TANORIMRN: [id number] of Birth: 3May 28, 1987

## 2020-01-10 ENCOUNTER — Ambulatory Visit: Payer: No Typology Code available for payment source

## 2020-01-10 ENCOUNTER — Other Ambulatory Visit: Payer: Self-pay

## 2020-01-10 DIAGNOSIS — M6281 Muscle weakness (generalized): Secondary | ICD-10-CM

## 2020-01-10 DIAGNOSIS — M25551 Pain in right hip: Secondary | ICD-10-CM

## 2020-01-10 DIAGNOSIS — G8929 Other chronic pain: Secondary | ICD-10-CM

## 2020-01-10 DIAGNOSIS — S73191D Other sprain of right hip, subsequent encounter: Secondary | ICD-10-CM | POA: Diagnosis not present

## 2020-01-11 NOTE — Therapy (Signed)
[place], [place], [address] Phone: [phone]   Fax:  [phone]  Physical Therapy Treatment  Patient Details  Name: Alvin Johnson MRN: [id number] Date of Birth: 1985-03-04 Referring Provider (PT): Nicholes Stairs, MD   Encounter Date: 01/10/2020   PT End of Session - 01/11/20 2204    Visit Number 11    Number of Visits 17    Date for PT Re-Evaluation 02/02/20    Authorization Type [place] FOCUS    Progress Note Due on Visit 10    PT Start Time 1400    PT Stop Time 1445    PT Time Calculation (min) 45 min    Activity Tolerance Patient tolerated treatment well    Behavior During Therapy [facility] for tasks assessed/performed           Past Medical History:  Diagnosis Date  . GERD (gastroesophageal reflux disease)     Past Surgical History:  Procedure Laterality Date  . KNEE ARTHROSCOPY Left   . VASECTOMY    . WISDOM TOOTH EXTRACTION      There were no vitals filed for this visit.   Subjective Assessment - 01/11/20 2215    Subjective Pt reports he experienced a min. increase in R hip pain after the last PT session which is typical after a day of activity. The pain level was 1-2/10.    Diagnostic tests Anterior superior labral tear    Patient Stated Goals To run occasionally, to lift heavy amounts as needed for houshold activities and for an upcoming move.    Currently in Pain? Yes    Pain Score --   .5/10   Pain Location Hip    Pain Orientation Right    Pain Descriptors / Indicators Burning;Dull    Pain Type Chronic pain    Pain Onset More than a month ago    Pain Frequency Intermittent                             [facility] Adult PT Treatment/Exercise - 01/11/20 0001      Exercises   Exercises Lumbar;Knee/Hip      Knee/Hip Exercises: Aerobic   Elliptical 5 min, L5; Ramp 5; forwar and backward    Tread Mill Jogging; 5 min      Knee/Hip Exercises: Machines  for Strengthening   Cybex Knee Extension Both 35#; 15x2    Cybex Knee Flexion Both 45#, 15x2    Cybex Leg Press Both, 100#, 15x2    Other Machine L and R Hip ext and abd; 50, 15x2      Knee/Hip Exercises: Standing   Forward Step Up Right;Left;15 reps;Hand Hold: 0;2 sets    Forward Step Up Limitations 1 set c 20lbs    Other Standing Knee Exercises Hinged hip lifts, 30# then 45#, 12" height, 15x                    PT Short Term Goals - 01/03/20 1619      PT SHORT TERM GOAL #1   Title Pt will be ind in an initial HEP    Status Achieved      PT SHORT TERM GOAL #2   Title Pt will voice understanding od measures to reduce and management R hip pain    Baseline doing HEP which is helping to reduce pain overall, difficulty adjusting for in the moment when it hurts.  Status Partially Met             PT Long Term Goals - 01/09/20 0732      PT LONG TERM GOAL #1   Title Pt will be Ind in a final HEP to maintain or progress achieved LOF. 01/08/20: On-going. Pt's report of pain with work is consistently 0-1/10    Status On-going    Target Date 02/02/20      PT LONG TERM GOAL #5   Title Pt will be able to jog 10 mins with a pain report for the R hip not exceeding 1/10. 01/07/30: On-going-Pt is tolerating 10 mins, 5 min on the eliptical and 5 min on the TM with pain at 1/10.    Status On-going                 Plan - 01/11/20 2205    Clinical Impression Statement PT was provided to continue progressing the physical demands for LE strengthening and dynamic functional activities. Following today's progressions, pt reported his hips felt good with no change in .5/10 pain level. Pt will continue to benefit from skilled PT for LE/trunk strengthening and functional activities to maximize his functional mobility c manage hip pain.    Personal Factors and Comorbidities Other;Comorbidity 1    Examination-Activity Limitations Bend;Carry;Lift;Squat;Other;Locomotion Level     Examination-Participation Restrictions Other    Stability/Clinical Decision Making Stable/Uncomplicated    Clinical Decision Making Low    Rehab Potential Good    PT Frequency 2x / week    PT Duration 8 weeks    PT Treatment/Interventions ADLs/Self Care Home Management;Cryotherapy;Electrical Stimulation;Iontophoresis 29m/ml Dexamethasone;Moist Heat;Neuromuscular re-education;Balance training;Therapeutic exercise;Therapeutic activities;Functional mobility training;Stair training;Gait training;Patient/family education;Manual techniques;Dry needling;Passive range of motion;Taping;Vasopneumatic Device    PT Next Visit Plan ipad to video running on solid surface outside (if weather permits), progress reformer, check ankle mobility &challenge CKC stability    PT Home Exercise Plan [id number]    Consulted and Agree with Plan of Care Patient           Patient will benefit from skilled therapeutic intervention in order to improve the following deficits and impairments:  Decreased activity tolerance,Pain,Decreased strength  Visit Diagnosis: Tear of right acetabular labrum, subsequent encounter  Muscle weakness (generalized)  Chronic pain of right hip     Problem List There are no problems to display for this patient.  AGar PontoMS, PT 01/11/20 10:17 PM  [facility] [address]Phone: [phone]  Fax:  [phone] Name: Alvin LORIAMRN: [id number] of Birth: 3Oct 10, 1987

## 2020-01-15 ENCOUNTER — Ambulatory Visit: Payer: No Typology Code available for payment source | Attending: Orthopedic Surgery

## 2020-01-15 ENCOUNTER — Other Ambulatory Visit: Payer: Self-pay

## 2020-01-15 DIAGNOSIS — S73191D Other sprain of right hip, subsequent encounter: Secondary | ICD-10-CM | POA: Diagnosis not present

## 2020-01-15 DIAGNOSIS — G8929 Other chronic pain: Secondary | ICD-10-CM | POA: Insufficient documentation

## 2020-01-15 DIAGNOSIS — M6281 Muscle weakness (generalized): Secondary | ICD-10-CM | POA: Diagnosis present

## 2020-01-15 DIAGNOSIS — M25551 Pain in right hip: Secondary | ICD-10-CM | POA: Insufficient documentation

## 2020-01-15 NOTE — Therapy (Signed)
Alvin Johnson Alvin Johnson [address] Alvin [place], [place], [address] Phone: [phone]   Fax:  [phone]  Physical Therapy Treatment  Patient Details  Name: Alvin Johnson MRN: [id number] Date of Birth: 12/31/1985 Referring Provider (PT): Alvin Johnson   Encounter Date: 01/15/2020   PT End of Session - 01/15/20 1803    Visit Number 12    Number of Visits 17    Date for PT Re-Evaluation 02/02/20    Authorization Type Alvin Johnson    PT Start Time 1745    PT Stop Time 1830    PT Time Calculation (min) 45 min    Activity Tolerance Patient tolerated treatment well    Behavior During Therapy Alvin Johnson for tasks assessed/performed           Past Medical History:  Diagnosis Date  . GERD (gastroesophageal reflux disease)     Past Surgical History:  Procedure Laterality Date  . KNEE ARTHROSCOPY Left   . VASECTOMY    . WISDOM TOOTH EXTRACTION      There were no vitals filed for this visit.   Subjective Assessment - 01/15/20 1754    Subjective Pr reports his r hip status is the same with having a low increase in pain the day after a day of increased activity per PT session or working out on his own.    Limitations Walking;Standing    Diagnostic tests Anterior superior labral tear    Patient Stated Goals To run occasionally, to lift heavy amounts as needed for houshold activities and for an upcoming move.    Currently in Pain? Yes    Pain Score 0-No pain    Pain Location Hip    Pain Orientation Right    Pain Descriptors / Indicators Burning;Dull    Pain Type Chronic pain    Pain Onset More than a month ago    Pain Frequency Intermittent    Aggravating Factors  entended time on feet, quick movements, bending at hips, sex    Pain Relieving Factors Rest, changing positions    Effect of Pain on Daily Activities Minimal c manage activity                             Alvin Johnson -  01/15/20 0001      Knee/Hip Exercises: Standing   Lateral Step Up Right;Left;10 reps;Hand Hold: 0;Step Height: 8";3 sets    Lateral Step Up Limitations 25lbs    Lunge Walking - Round Trips 78ft at 4 c 2 10lbs    Other Standing Knee Exercises Hinged hip lifts, 45#, 12" height, 10x3    Other Standing Knee Exercises 4 way R LE c Freemotion 7lbs, 10x2 for each motion. Banded LE exs: side steps upright tunk; side steps athletic posture; 20 ft x 4 each                    PT Short Term Goals - 01/15/20 1914      PT SHORT TERM GOAL #2   Title Pt will voice understanding oF measures to reduce and management R hip pain. Pt is manging his activity level re; his R hip pain level.    Status Achieved    Target Date 01/15/20             PT Long Term Goals - 01/09/20 0732      PT LONG TERM GOAL #1  Title Pt will be Ind in a final HEP to maintain or progress achieved LOF. 01/08/20: On-going. Pt's report of pain with work is consistently 0-1/10    Status On-going    Target Date 02/02/20      PT LONG TERM GOAL #5   Title Pt will be able to jog 10 mins with a pain report for the R hip not exceeding 1/10. 01/07/30: On-going-Pt is tolerating 10 mins, 5 min on the eliptical and 5 min on the TM with pain at 1/10.    Status On-going                 Plan - 01/15/20 1858    Clinical Impression Statement Pt partcipated in PT with progressed ther exs either with increased resistance or repetiitons. Pt tolerated these progressions without adverse effects re; the R hip. Overall, pt is responding well to to isotonic  and dynamic strengthening.    Personal Factors and Comorbidities Other;Comorbidity 1    Examination-Activity Limitations Bend;Carry;Lift;Squat;Other;Locomotion Level    Examination-Participation Restrictions Other    Stability/Clinical Decision Making Stable/Uncomplicated    Clinical Decision Making Low    Rehab Potential Good    PT Frequency 2x / week    PT Duration 8  weeks    PT Treatment/Interventions ADLs/Self Care Home Management;Cryotherapy;Electrical Stimulation;Iontophoresis 4mg /ml Dexamethasone;Moist Heat;Neuromuscular re-education;Balance training;Therapeutic exercise;Therapeutic activities;Functional mobility training;Stair training;Gait training;Patient/family education;Manual techniques;Dry needling;Passive range of motion;Taping;Vasopneumatic Device    PT Next Visit Plan Discuss course of care for PT related to pt's level of progression, continuation vs. DC.    PT Home Exercise Plan [facility]    Consulted and Agree with Plan of Care Patient           Patient will benefit from skilled therapeutic intervention in order to improve the following deficits and impairments:  Decreased activity tolerance,Pain,Decreased strength  Visit Diagnosis: Tear of right acetabular labrum, subsequent encounter  Muscle weakness (generalized)  Chronic pain of right hip     Problem List There are no problems to display for this patient.   Alvin Johnson, PT 01/15/20 7:17 PM  Alvin Johnson [address] [place], [place], [place] Phone: [phone]   Fax:  [phone]  Name: Alvin Johnson MRN: Alvin Johnson Date of Birth: 06-Jul-1985

## 2020-01-17 ENCOUNTER — Ambulatory Visit: Payer: No Typology Code available for payment source

## 2020-01-22 ENCOUNTER — Ambulatory Visit: Payer: No Typology Code available for payment source

## 2020-01-22 ENCOUNTER — Other Ambulatory Visit: Payer: Self-pay

## 2020-01-22 DIAGNOSIS — S73191D Other sprain of right hip, subsequent encounter: Secondary | ICD-10-CM

## 2020-01-22 DIAGNOSIS — M6281 Muscle weakness (generalized): Secondary | ICD-10-CM

## 2020-01-22 DIAGNOSIS — G8929 Other chronic pain: Secondary | ICD-10-CM

## 2020-01-22 NOTE — Therapy (Signed)
[place], [place], [address] Phone: [phone]   Fax:  [phone]  Physical Therapy Treatment/Discharge Summary  Patient Details  Name: Alvin Johnson MRN: [id number] Date of Birth: 05-Sep-1985 Referring Provider (PT): Nicholes Stairs, MD   Encounter Date: 01/22/2020   PT End of Session - 01/22/20 1805    Visit Number 13    Number of Visits 17    Date for PT Re-Evaluation 02/02/20    Authorization Type [employer]    PT Start Time 2671    PT Stop Time 1750    PT Time Calculation (min) 45 min    Activity Tolerance Patient tolerated treatment well    Behavior During Therapy [facility] for tasks assessed/performed           Past Medical History:  Diagnosis Date  . GERD (gastroesophageal reflux disease)     Past Surgical History:  Procedure Laterality Date  . KNEE ARTHROSCOPY Left   . VASECTOMY    . WISDOM TOOTH EXTRACTION      There were no vitals filed for this visit.   Subjective Assessment - 01/22/20 1711    Subjective Pt reports the status of his R hip has remained the same. Pt feels good about his progress, he understands his HEP and is ready to discontinue PT.    Limitations Walking;Standing    Diagnostic tests Anterior superior labral tear    Patient Stated Goals To run occasionally, to lift heavy amounts as needed for houshold activities and for an upcoming move.    Currently in Pain? Yes    Pain Score 0-No pain    Pain Location Hip    Pain Orientation Right    Pain Descriptors / Indicators Burning;Dull    Pain Type Chronic pain    Pain Onset More than a month ago    Pain Frequency Intermittent              [facility] PT Assessment - 01/22/20 0001      Strength   Right Hip Flexion 5/5    Right Hip ABduction 5/5    Left Hip Extension 5/5                         [facility] Adult PT Treatment/Exercise - 01/22/20 0001      Exercises   Exercises Lumbar;Knee/Hip       Knee/Hip Exercises: Aerobic   Elliptical 5 min, L5; Ramp 5; forwar and backward      Knee/Hip Exercises: Machines for Strengthening   Cybex Knee Extension Both 40#; 15x2    Cybex Knee Flexion Both 50#, 15x2    Cybex Leg Press Both, 120#, 15x2    Other Machine L and R Hip flex, ext and abd; 50, 15x2      Knee/Hip Exercises: Standing   Forward Lunges Right;Left;3 sets    Forward Lunges Limitations 1st set no wt; 2 and 3 sets 15lbs each hnad    Other Standing Knee Exercises Hinged hip lifts, 50#, 12" height, 10x3                    PT Short Term Goals - 01/15/20 1914      PT SHORT TERM GOAL #2   Title Pt will voice understanding oF measures to reduce and management R hip pain. Pt is manging his activity level re; his R hip pain level.    Status Achieved  Target Date 01/15/20             PT Long Term Goals - 01/22/20 1746      PT LONG TERM GOAL #1   Title Pt will be Ind in a final HEP to maintain or progress achieved LOF.. 01/22/20:Met    Status Achieved    Target Date 01/22/20      PT LONG TERM GOAL #2   Title Pt will report 1/10 pain for the R hip with usual work duties.  12/28/20 On-going. Pt's report of pain with work is consistently 0-1/10. 01/22/20: 0-1/10-Met    Status Achieved    Target Date 01/22/20      PT LONG TERM GOAL #3   Title Pt will demonstrate 5/5 strength for R hip abd and ER. Met    Status Achieved    Target Date 01/22/20      PT LONG TERM GOAL #4   Title Pt will be able to lift, push, pull at least 50 lbs repetitively with a pain report for the R hip not exceeding 1/10    Status Achieved    Target Date 01/22/20      PT LONG TERM GOAL #5   Title Pt will be able to jog 10 mins with a pain report for the R hip not exceeding 1/10. 01/07/30: On-going-Pt is tolerating 10 mins, 5 min on the eliptical and 5 min on the TM with pain at 1/10. 01/12/20 Met    Status Achieved                 Plan - 01/22/20 1705    Clinical Impression  Statement Pt has made good progress in hip/LE/trunk strength and functional activities while maintaining R hip discomfort at a low level. Pt is pleased with his progress, Ind in c a HEP and is able to continue the HEP at home and in the gym. Pt is in agreement with DC from  PT services.    Personal Factors and Comorbidities Other;Comorbidity 1    Examination-Activity Limitations Bend;Carry;Lift;Squat;Other;Locomotion Level    Examination-Participation Restrictions Other    Stability/Clinical Decision Making Stable/Uncomplicated    Rehab Potential Good    PT Frequency 2x / week    PT Duration 8 weeks    PT Treatment/Interventions ADLs/Self Care Home Management;Cryotherapy;Electrical Stimulation;Iontophoresis 15m/ml Dexamethasone;Moist Heat;Neuromuscular re-education;Balance training;Therapeutic exercise;Therapeutic activities;Functional mobility training;Stair training;Gait training;Patient/family education;Manual techniques;Dry needling;Passive range of motion;Taping;Vasopneumatic Device    PT Next Visit Plan Discuss course of care for PT related to pt's level of progression, continuation vs. DC.    [facility]   Consulted and Agree with Plan of Care Patient           Patient will benefit from skilled therapeutic intervention in order to improve the following deficits and impairments:  Decreased activity tolerance,Pain,Decreased strength  Visit Diagnosis: Tear of right acetabular labrum, subsequent encounter  Muscle weakness (generalized)  Chronic pain of right hip     Problem List There are no problems to display for this patient.  Alvin Johnson, PT 01/22/20 6:15 PM   PHYSICAL THERAPY DISCHARGE SUMMARY  Visits from Start of Care: 13  Current functional level related to goals / functional outcomes: see above   Remaining deficits: see above   Education / Equipment: HEP   Plan: Patient agrees to discharge.  Patient goals were met. Patient is being  discharged due to meeting the stated rehab goals.  ?????  And being pleased with his progress.  [place], [place], [address] Phone: [phone]   Fax:  [phone]  Name: Alvin Johnson MRN: [id number] Date of Birth: 1985/04/21

## 2020-01-24 ENCOUNTER — Ambulatory Visit: Payer: No Typology Code available for payment source

## 2020-05-25 ENCOUNTER — Encounter: Payer: Self-pay | Admitting: Emergency Medicine

## 2020-05-25 ENCOUNTER — Other Ambulatory Visit: Payer: Self-pay

## 2020-05-25 ENCOUNTER — Ambulatory Visit
Admission: EM | Admit: 2020-05-25 | Discharge: 2020-05-25 | Disposition: A | Discharge status: Home / Self Care | Payer: No Typology Code available for payment source | Attending: Emergency Medicine | Admitting: Emergency Medicine

## 2020-05-25 DIAGNOSIS — R519 Headache, unspecified: Secondary | ICD-10-CM

## 2020-05-25 DIAGNOSIS — S80262A Insect bite (nonvenomous), left knee, initial encounter: Secondary | ICD-10-CM

## 2020-05-25 DIAGNOSIS — W57XXXA Bitten or stung by nonvenomous insect and other nonvenomous arthropods, initial encounter: Secondary | ICD-10-CM | POA: Diagnosis not present

## 2020-05-25 MED ORDER — IBUPROFEN 800 MG PO TABS: 800.0000 mg | ORAL_TABLET | Freq: Three times a day (TID) | ORAL | 0 refills | Status: AC

## 2020-05-25 MED ORDER — DOXYCYCLINE HYCLATE 100 MG PO CAPS: 200.0000 mg | ORAL_CAPSULE | Freq: Once | ORAL | 0 refills | Status: AC

## 2020-05-25 NOTE — Discharge Instructions (Addendum)
Please take 2 tablets of doxycycline with food today Tylenol and ibuprofen for headaches COVID test pending blood work for [facility] spotted fever, Lyme, monitor MyChart for results  Please follow-up if any symptoms not improving or worsening

## 2020-05-25 NOTE — ED Triage Notes (Signed)
Pt here for frontal HA starting Friday after noticing a tick bite on Thursday; pt sts also some fatigue

## 2020-05-25 NOTE — ED Provider Notes (Signed)
[facility]    CSN: [id number] Arrival date & time: 05/25/20  0807      History   Chief Complaint Chief Complaint  Patient presents with  . Insect Bite  . Headache    HPI Alvin Johnson is a 35 y.o. male history of GERD presenting today for evaluation of headache and fatigue after tick bite.  Reports on Thursday he noticed he was having a frontal headache as well as associated fatigue which have persisted over the past few days.  On Friday he noticed a small tick to his left posterior knee which she removed on Friday.  He is unsure exactly how long the tick was present, but thinks it may have been there since Tuesday as this was the last time he was working out at the yard.  He denies any URI symptoms.  Denies nausea vomiting or abdominal pain.  Denies other rashes.  Small area of redness around bite.  HPI  Past Medical History:  Diagnosis Date  . GERD (gastroesophageal reflux disease)     There are no problems to display for this patient.   Past Surgical History:  Procedure Laterality Date  . KNEE ARTHROSCOPY Left   . VASECTOMY    . WISDOM TOOTH EXTRACTION         Home Medications    Prior to Admission medications   Medication Sig Start Date End Date Taking? Authorizing Provider  doxycycline (VIBRAMYCIN) 100 MG capsule Take 2 capsules (200 mg total) by mouth once for 1 dose. 05/25/20 05/25/20 Yes Daizee Firmin C, PA-C  ibuprofen (ADVIL) 800 MG tablet Take 1 tablet (800 mg total) by mouth 3 (three) times daily. 05/25/20  Yes Laron Angelini C, PA-C  GLUTAMINE PO Take 1 tablet by mouth daily.    [provider]  Multiple Vitamins-Minerals (MENS MULTIVITAMIN PLUS) TABS Take 1 tablet by mouth daily.    [provider]  naproxen (NAPROSYN) 500 MG tablet Take 1 tablet (500 mg total) by mouth 2 (two) times daily with a meal. 12/22/19   Jarold Motto, PA  omeprazole (PRILOSEC) 20 MG capsule Take 1 capsule (20 mg total) by mouth  daily. Patient not taking: Reported on 11/30/2019 06/21/18 05/25/20  Georgetta Haber, NP    Family History Family History  Problem Relation Age of Onset  . Hyperlipidemia Father   . Hypertension Father   . CAD Father   . Colon polyps Father   . Heart disease Father   . CAD Paternal Grandfather   . Heart disease Paternal Grandfather   . Diabetes Paternal Grandfather   . Colon cancer Paternal Grandmother   . Pancreatic cancer Paternal Aunt   . Esophageal cancer Neg Hx   . Stomach cancer Neg Hx   . Rectal cancer Neg Hx     Social History Social History   Tobacco Use  . Smoking status: Never Smoker  . Smokeless tobacco: Never Used  Vaping Use  . Vaping Use: Never used  Substance Use Topics  . Alcohol use: Yes    Alcohol/week: 0.0 standard drinks    Comment: 1 beer every 3-4 months  . Drug use: No     Allergies   Patient has no known allergies.   Review of Systems Review of Systems  Constitutional: Positive for fatigue. Negative for activity change, appetite change, chills and fever.  HENT: Negative for congestion, ear pain, rhinorrhea, sinus pressure, sore throat and trouble swallowing.   Eyes: Negative for discharge and redness.  Respiratory:  Negative for cough, chest tightness and shortness of breath.   Cardiovascular: Negative for chest pain.  Gastrointestinal: Negative for abdominal pain, diarrhea, nausea and vomiting.  Musculoskeletal: Negative for myalgias.  Skin: Negative for rash.  Neurological: Positive for headaches. Negative for dizziness and light-headedness.     Physical Exam Triage Vital Signs ED Triage Vitals  Enc Vitals Group     BP      Pulse      Resp      Temp      Temp src      SpO2      Weight      Height      Head Circumference      Peak Flow      Pain Score      Pain Loc      Pain Edu?      Excl. in GC?    No data found.  Updated Vital Signs BP 135/76 (BP Location: Left Arm)   Pulse 61   Temp 97.8 F (36.6 C) (Oral)    Resp 18   SpO2 99%   Visual Acuity Right Eye Distance:   Left Eye Distance:   Bilateral Distance:    Right Eye Near:   Left Eye Near:    Bilateral Near:     Physical Exam Vitals and nursing note reviewed.  Constitutional:      Appearance: He is well-developed.     Comments: No acute distress  HENT:     Head: Normocephalic and atraumatic.     Ears:     Comments: Bilateral ears without tenderness to palpation of external auricle, tragus and mastoid, EAC's without erythema or swelling, TM's with good bony landmarks and cone of light. Non erythematous.     Nose: Nose normal.     Mouth/Throat:     Comments: Oral mucosa pink and moist, no tonsillar enlargement or exudate. Posterior pharynx patent and nonerythematous, no uvula deviation or swelling. Normal phonation. Eyes:     Conjunctiva/sclera: Conjunctivae normal.  Cardiovascular:     Rate and Rhythm: Normal rate.  Pulmonary:     Effort: Pulmonary effort is normal. No respiratory distress.     Comments: Breathing comfortably at rest, CTABL, no wheezing, rales or other adventitious sounds auscultated Abdominal:     General: There is no distension.  Musculoskeletal:        General: Normal range of motion.     Cervical back: Neck supple.  Skin:    General: Skin is warm and dry.     Comments: Small area of erythema with central punctate lesion noted to posterior aspect of distal lower leg/near left knee  Neurological:     Mental Status: He is alert and oriented to person, place, and time.      UC Treatments / Results  Labs (all labs ordered are listed, but only abnormal results are displayed) Labs Reviewed  NOVEL CORONAVIRUS, NAA  ROCKY MTN SPOTTED FVR ABS PNL(IGG+IGM)  B. BURGDORFI ANTIBODIES    EKG   Radiology No results found.  Procedures Procedures (including critical care time)  Medications Ordered in UC Medications - No data to display  Initial Impression / Assessment and Plan / UC Course  I have  reviewed the triage vital signs and the nursing notes.  Pertinent labs & imaging results that were available during my care of the patient were reviewed by me and considered in my medical decision making (see chart for details).     Headache and  fatigue-screening for COVID, given recent tick bite we will screen for [facility] spotted fever and Lyme, but feel this is less likely given timing of symptoms and tick bite so recent.  Within 72 hours of tick removal, will provide doxycycline prophylactic dose of 200 mg.  Recommending anti-inflammatories and symptomatic control of symptoms in the interim, rest and fluids.  We will call with results if abnormal.  Discussed strict return precautions. Patient verbalized understanding and is agreeable with plan.  Final Clinical Impressions(s) / UC Diagnoses   Final diagnoses:  Acute nonintractable headache, unspecified headache type  Tick bite of left knee, initial encounter     Discharge Instructions     Please take 2 tablets of doxycycline with food today Tylenol and ibuprofen for headaches COVID test pending blood work for [facility] spotted fever, Lyme, monitor MyChart for results  Please follow-up if any symptoms not improving or worsening    ED Prescriptions    Medication Sig Dispense Auth. Provider   doxycycline (VIBRAMYCIN) 100 MG capsule Take 2 capsules (200 mg total) by mouth once for 1 dose. 2 capsule Divonte Senger C, PA-C   ibuprofen (ADVIL) 800 MG tablet Take 1 tablet (800 mg total) by mouth 3 (three) times daily. 21 tablet Conner Muegge, [place] C, PA-C     PDMP not reviewed this encounter.   Lew Dawes, [place] 05/25/20 [phone]

## 2020-05-26 LAB — SARS-COV-2, NAA 2 DAY TAT

## 2020-05-26 LAB — NOVEL CORONAVIRUS, NAA: SARS-CoV-2, NAA: NOT DETECTED

## 2020-05-28 LAB — ROCKY MTN SPOTTED FVR ABS PNL(IGG+IGM)
RMSF IgG: NEGATIVE
RMSF IgM: 0.34 index (ref 0.00–0.89)

## 2020-05-29 LAB — LYME DISEASE SEROLOGY W/REFLEX: Lyme Total Antibody EIA: NEGATIVE

## 2020-05-29 LAB — SPECIMEN STATUS REPORT

## 2020-11-10 DIAGNOSIS — M779 Enthesopathy, unspecified: Secondary | ICD-10-CM

## 2020-11-20 ENCOUNTER — Encounter: Payer: Self-pay | Admitting: Podiatry

## 2020-11-20 ENCOUNTER — Ambulatory Visit: Payer: No Typology Code available for payment source | Admitting: Podiatry

## 2020-11-20 ENCOUNTER — Ambulatory Visit (INDEPENDENT_AMBULATORY_CARE_PROVIDER_SITE_OTHER): Payer: No Typology Code available for payment source

## 2020-11-20 ENCOUNTER — Other Ambulatory Visit: Payer: Self-pay

## 2020-11-20 DIAGNOSIS — M21619 Bunion of unspecified foot: Secondary | ICD-10-CM

## 2020-11-20 DIAGNOSIS — M2042 Other hammer toe(s) (acquired), left foot: Secondary | ICD-10-CM | POA: Diagnosis not present

## 2020-11-20 DIAGNOSIS — L84 Corns and callosities: Secondary | ICD-10-CM

## 2020-11-20 NOTE — Progress Notes (Signed)
Subjective:   Patient ID: Alvin Johnson, male   DOB: 35 y.o.   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  At this point debridement accomplished x-rays reviewed and orthotics to be dispensed  X-rays indicate that there is structural bunion deformity left over right with elevation of the intermetatarsal angle mild deviation of the hallux

## 2020-11-24 ENCOUNTER — Telehealth: Payer: Self-pay | Admitting: Podiatry

## 2020-11-24 NOTE — Telephone Encounter (Signed)
Refurbished orthotics in.. lvm for pt ok to pick them up.Marland KitchenMarland Kitchen

## 2020-12-15 ENCOUNTER — Other Ambulatory Visit: Payer: Self-pay

## 2020-12-15 ENCOUNTER — Encounter: Payer: Self-pay | Admitting: Physical Therapy

## 2020-12-15 ENCOUNTER — Other Ambulatory Visit (HOSPITAL_COMMUNITY): Payer: Self-pay

## 2020-12-15 ENCOUNTER — Ambulatory Visit: Payer: PRIVATE HEALTH INSURANCE | Attending: Occupational Medicine | Admitting: Physical Therapy

## 2020-12-15 DIAGNOSIS — M25511 Pain in right shoulder: Secondary | ICD-10-CM | POA: Diagnosis present

## 2020-12-15 DIAGNOSIS — G8911 Acute pain due to trauma: Secondary | ICD-10-CM | POA: Insufficient documentation

## 2020-12-15 DIAGNOSIS — M25551 Pain in right hip: Secondary | ICD-10-CM | POA: Diagnosis present

## 2020-12-15 DIAGNOSIS — M6281 Muscle weakness (generalized): Secondary | ICD-10-CM | POA: Insufficient documentation

## 2020-12-15 DIAGNOSIS — G8929 Other chronic pain: Secondary | ICD-10-CM | POA: Insufficient documentation

## 2020-12-15 MED ORDER — PANTOPRAZOLE SODIUM 40 MG PO TBEC
40.0000 mg | DELAYED_RELEASE_TABLET | Freq: Every day | ORAL | 0 refills | Status: AC
  Filled 2020-12-15: qty 30, 30d supply, fill #0

## 2020-12-16 ENCOUNTER — Other Ambulatory Visit (HOSPITAL_COMMUNITY): Payer: Self-pay

## 2020-12-16 MED ORDER — PANTOPRAZOLE SODIUM 40 MG PO TBEC
40.0000 mg | DELAYED_RELEASE_TABLET | Freq: Every day | ORAL | 1 refills | Status: AC
  Filled 2020-12-16 – 2021-01-08 (×2): qty 30, 30d supply, fill #0

## 2020-12-16 NOTE — Therapy (Signed)
[facility] [address] [place], [place], [address] Phone: [phone]   Fax:  [phone]  Physical Therapy Evaluation  Patient Details  Name: Alvin Johnson MRN: [id number] Date of Birth: 02/11/1985 Referring Provider (PT): Dr. Lucia Gaskins   Encounter Date: 12/15/2020   PT End of Session - 12/15/20 1432     Visit Number 1    Number of Visits 5    Date for PT Re-Evaluation 01/26/21    Authorization Type UMR    Authorization - Visit Number 1    Authorization - Number of Visits 5    PT Start Time 1330    PT Stop Time 1413    PT Time Calculation (min) 43 min    Activity Tolerance Patient tolerated treatment well    Behavior During Therapy [facility] for tasks assessed/performed             Past Medical History:  Diagnosis Date   GERD (gastroesophageal reflux disease)     Past Surgical History:  Procedure Laterality Date   KNEE ARTHROSCOPY Left    VASECTOMY     WISDOM TOOTH EXTRACTION      There were no vitals filed for this visit.    Subjective Assessment - 12/15/20 1334     Subjective Patient was walking briskly 09/28/20 and hit a heavy metal "hook" with his Rt UE while addressing an emergency situation.   His shoulder was forcibly moved back. The pain has persisted but has improved since then.  He has not had imaging for this issue.  He has continued to exercise with modifications for certain exercises.  He can do bicep curls but does have issues with bench press and he has always avoided overhead pressing. He is able to do his job but does continue to notice intermittent burning in Rt ant. shoulder, mild pain with driving using Rt UE on the wheel making L turn.  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    Pertinent History hip labral tear, knee scope    Patient Stated Goals Prevent further injury    Currently in Pain? Yes    Pain  Score 1     Pain Location Shoulder    Pain Orientation Right    Pain Descriptors / Indicators Aching;Sore    Pain Type Acute pain    Pain Radiating Towards no    Pain Onset More than a month ago    Pain Frequency Several days a week    Aggravating Factors  certain movements (ER/IR) driving    Pain Relieving Factors rest,    Effect of Pain on Daily Activities limits wgt he can lift    Multiple Pain Sites No              [facility] PT Assessment - 12/16/20 0001       Assessment   Medical Diagnosis Rt shoulder contusion    Referring Provider (PT) Dr. Lucia Gaskins    Onset Date/Surgical Date 09/28/20    Hand Dominance Right    Next MD Visit as needed    Prior Therapy yes not for this issue      Precautions   Precautions None      Restrictions   Weight Bearing Restrictions No      Home Environment   Living Environment Private residence      Prior Function   Level of Independence Independent  Vocation Full time employment    Vocation Requirements IR , lifting    Leisure fitness, work, hiking, has twin girls      Cognition   Overall Cognitive Status Within Functional Limits for tasks assessed      Observation/Other Assessments   Focus on Therapeutic Outcomes (FOTO)  63%   goal 81     Sensation   Light Touch Appears Intact      Coordination   Gross Motor Movements are Fluid and Coordinated Not tested      Posture/Postural Control   Posture/Postural Control Postural limitations    Postural Limitations Rounded Shoulders;Forward head    Posture Comments Rt humeral head more anterior than L      AROM   Overall AROM Comments tight Rt post capsule    Right Shoulder Flexion 155 Degrees    Right Shoulder ABduction 160 Degrees    Right Shoulder Internal Rotation --   FR to mid back pinch Ant.,less than Lt UE   Right Shoulder External Rotation --   FR to T4     PROM   Overall PROM Comments WNL pinching superior anterior shoulder , lacks full IR      Strength   Right  Shoulder Flexion 4+/5    Right Shoulder ABduction 4+/5    Right Shoulder Internal Rotation 5/5    Right Shoulder External Rotation 5/5    Right Hand Grip (lbs) 106    Left Hand Grip (lbs) 105      Palpation   Palpation comment sore anterio shoulder, bicipital groove, coracobrachialis      Special Tests    Special Tests Rotator Cuff Impingement;Biceps/Labral Tests    Rotator Cuff Impingment tests Empty Can test;Full Can test;Neer impingement test      Neer Impingement test    Findings Negative      Empty Can test   Findings Negative      Full Can test   Findings Negative               [facility] PT Assessment - 12/16/20 0001       Assessment   Medical Diagnosis Rt shoulder contusion    Referring Provider (PT) Dr. Lucia Gaskins    Onset Date/Surgical Date 09/28/20    Hand Dominance Right    Next MD Visit as needed    Prior Therapy yes not for this issue      Precautions   Precautions None      Restrictions   Weight Bearing Restrictions No      Home Environment   Living Environment Private residence      Prior Function   Level of Independence Independent    Vocation Full time employment    Vocation Requirements IR , lifting    Leisure fitness, work, hiking, has twin girls      Cognition   Overall Cognitive Status Within Functional Limits for tasks assessed      Observation/Other Assessments   Focus on Therapeutic Outcomes (FOTO)  63%   goal 81     Sensation   Light Touch Appears Intact      Coordination   Gross Motor Movements are Fluid and Coordinated Not tested      Posture/Postural Control   Posture/Postural Control Postural limitations    Postural Limitations Rounded Shoulders;Forward head    Posture Comments Rt humeral head more anterior than L      AROM   Overall AROM Comments tight Rt post capsule    Right  Shoulder Flexion 155 Degrees    Right Shoulder ABduction 160 Degrees    Right Shoulder Internal Rotation --   FR to mid back pinch Ant.,less  than Lt UE   Right Shoulder External Rotation --   FR to T4     PROM   Overall PROM Comments WNL pinching superior anterior shoulder , lacks full IR      Strength   Right Shoulder Flexion 4+/5    Right Shoulder ABduction 4+/5    Right Shoulder Internal Rotation 5/5    Right Shoulder External Rotation 5/5    Right Hand Grip (lbs) 106    Left Hand Grip (lbs) 105      Palpation   Palpation comment sore anterio shoulder, bicipital groove, coracobrachialis      Special Tests    Special Tests Rotator Cuff Impingement;Biceps/Labral Tests    Rotator Cuff Impingment tests Empty Can test;Full Can test;Neer impingement test      Neer Impingement test    Findings Negative      Empty Can test   Findings Negative      Full Can test   Findings Negative                        Objective measurements completed on examination: See above findings.                PT Education - 12/15/20 1432     Education Details PT/POC, HEP    Person(s) Educated Patient    Methods Explanation    Comprehension Verbalized understanding                 PT Long Term Goals - 12/15/20 1540       PT LONG TERM GOAL #1   Title Pt will be Ind in a final HEP for Rt UE  to maintain or progress achieved LOF    Time 6    Period Weeks    Status New    Target Date 01/26/21      PT LONG TERM GOAL #2   Title Pt will be able to lift daughter without increased Rt shoulder pain    Time 6    Period Weeks    Status New    Target Date 01/26/21      PT LONG TERM GOAL #3   Title Pt will experience no pain with usual gym exercises, weights    Time 6    Period Weeks    Status New    Target Date 01/26/21      PT LONG TERM GOAL #4   Title Pt will be able to cont to push, pull, lift with proper body mechanics > 50 lbs for safe work habits.    Time 6    Period Weeks    Status New    Target Date 01/27/21                    Plan - 12/16/20 0946     Clinical  Impression Statement Patient presents for low complexity eval of R shoulder pain which occurred almost 3 mos ago.  He presents with signs and symptoms consistent with contusion, possible capsule strain and associated symptoms of pain, tight posterior capsule, min weakness and difficulty lifting overhead.  He knows to appropriately modify workouts due to pain and potential further injury. I assured him this issue would resolve and with skilled PT he should be able to advance to  a normal level of work and activity .    Examination-Activity Limitations Carry;Lift;Reach Overhead;Caring for Others    Examination-Participation Restrictions Community Activity;Occupation    Stability/Clinical Decision Making Stable/Uncomplicated    Clinical Decision Making Low    Rehab Potential Excellent    PT Frequency 2x / week    PT Duration 4 weeks   only approved 4 treatments initially   PT Treatment/Interventions ADLs/Self Care Home Management;Cryotherapy;Patient/family education;Manual techniques;Dry needling;Taping;Passive range of motion;Therapeutic exercise;Moist Heat;Therapeutic activities;Other (comment)   pilates   PT Next Visit Plan check HEP, advance stability and mobilty of Rt UE: UBE, prone scapular work.  closed chain (Reformer)    PT Home Exercise Plan Access Code: [email or web address]    Consulted and Agree with Plan of Care Patient             Patient will benefit from skilled therapeutic intervention in order to improve the following deficits and impairments:  Decreased strength, Impaired UE functional use, Postural dysfunction, Decreased mobility, Pain  Visit Diagnosis: Acute shoulder pain due to trauma, right     Problem List There are no problems to display for this patient.   Chianna Spirito, PT 12/16/2020, 9:55 AM  [facility] [address] [place], [place], [address] Phone: [phone]   Fax:  [phone]  Name: MOMODOU CONSIGLIO MRN:  [id number] Date of Birth: 27-Oct-1985  Karie Mainland, PT 12/16/20 9:55 AM Phone: [phone] Fax: [phone]

## 2020-12-16 NOTE — Patient Instructions (Signed)
Access Code: [email or web address] URL: [email or web address] Date: 12/16/2020 Prepared by: Karie Mainland  Exercises Sleeper Stretch - 1 x daily - 7 x weekly - 1 sets - 5 reps - 15 hold Sidelying Shoulder ER with Towel and Dumbbell - 1 x daily - 7 x weekly - 2 sets - 10 reps - 5 hold Shoulder PNF D2 with Resistance - 1 x daily - 7 x weekly - 2 sets - 10 reps - 5 hold Wall Angels - 1 x daily - 7 x weekly - 2 sets - 10 reps - 30 hold

## 2020-12-22 ENCOUNTER — Ambulatory Visit: Payer: PRIVATE HEALTH INSURANCE | Admitting: Physical Therapy

## 2020-12-22 ENCOUNTER — Encounter: Payer: Self-pay | Admitting: Physical Therapy

## 2020-12-22 ENCOUNTER — Other Ambulatory Visit: Payer: Self-pay

## 2020-12-22 DIAGNOSIS — M6281 Muscle weakness (generalized): Secondary | ICD-10-CM

## 2020-12-22 DIAGNOSIS — M25511 Pain in right shoulder: Secondary | ICD-10-CM | POA: Diagnosis not present

## 2020-12-22 DIAGNOSIS — G8929 Other chronic pain: Secondary | ICD-10-CM

## 2020-12-22 DIAGNOSIS — M25551 Pain in right hip: Secondary | ICD-10-CM

## 2020-12-22 DIAGNOSIS — G8911 Acute pain due to trauma: Secondary | ICD-10-CM

## 2020-12-22 NOTE — Therapy (Signed)
[facility] Outpatient Rehabilitation [facility] [address] [place], [place], [address] Phone: [phone]   Fax:  [phone]  Physical Therapy Treatment  Patient Details  Name: Alvin Johnson MRN: [id number] Date of Birth: [birth year] Referring Provider (PT): Dr. Lucia Gaskins   Encounter Date: 12/22/2020   PT End of Session - 12/22/20 0941     Visit Number 2    Number of Visits 5    Date for PT Re-Evaluation 01/26/21    Authorization Type UMR    Authorization - Visit Number 2    Authorization - Number of Visits 5    PT Start Time 0935    PT Stop Time 1015    PT Time Calculation (min) 40 min    Activity Tolerance Patient tolerated treatment well    Behavior During Therapy [facility] for tasks assessed/performed             Past Medical History:  Diagnosis Date   GERD (gastroesophageal reflux disease)     Past Surgical History:  Procedure Laterality Date   KNEE ARTHROSCOPY Left    VASECTOMY     WISDOM TOOTH EXTRACTION      There were no vitals filed for this visit.   Subjective Assessment - 12/22/20 0939     Subjective No pain, has been working out a little bit I havent done anything to make it hurt .    Currently in Pain? No/denies                 [facility] Adult PT Treatment/Exercise:   Therapeutic Exercise: Supine shoulder overhead ER pulse green  Horizontal pull green Q ped bird dog  Thoracic mobility each side in Q ped  Body Blade 45 sec  Abduction 45 deg  Flexon 90 deg  ER /IR at neutral   Springboard   Shoulder extension x 10 roll down bar   Double arm and then single arm press x 10   Chest press double arm x 10  Chest fly x 8 each side   Lateral Walking iso ER x 1 min  ER at neutral (slastix) x 20 then 90 deg x 15 each  Wall for posture goal post to IR x 10 Wall angel W arms x 10  Standing forward raise, lateral raise on wall , cued for form  Bent over row on mat x 20, 10 reps with scapular protraction  and then stability    Manual Therapy:   Neuromuscular re-ed: N/A   Therapeutic Activity: N/A   Modalities: N/A   Self Care: N/A   Consider / progression for next session:       PT Long Term Goals - 12/15/20 1540       PT LONG TERM GOAL #1   Title Pt will be Ind in a final HEP for Rt UE  to maintain or progress achieved LOF    Time 6    Period Weeks    Status New    Target Date 01/26/21      PT LONG TERM GOAL #2   Title Pt will be able to lift daughter without increased Rt shoulder pain    Time 6    Period Weeks    Status New    Target Date 01/26/21      PT LONG TERM GOAL #3   Title Pt will experience no pain with usual gym exercises, weights    Time 6    Period Weeks    Status New  Target Date 01/26/21      PT LONG TERM GOAL #4   Title Pt will be able to cont to push, pull, lift with proper body mechanics > 50 lbs for safe work habits.    Time 6    Period Weeks    Status New    Target Date 01/27/21                   Plan - 12/22/20 1000     Clinical Impression Statement Patient doing well, able to perform all exercises without pain.  He does require min cues for positioning humeral head with forward raise.  Worked on sustained postures requiring muscle endurance appropriate for his occupation.    PT Treatment/Interventions ADLs/Self Care Home Management;Cryotherapy;Patient/family education;Manual techniques;Dry needling;Taping;Passive range of motion;Therapeutic exercise;Moist Heat;Therapeutic activities;Other (comment)    PT Next Visit Plan check HEP, advance stability and mobilty of Rt UE: UBE, prone scapular work.  closed chain (Reformer)    PT Home Exercise Plan Access Code: [email or web address]    Consulted and Agree with Plan of Care Patient             Patient will benefit from skilled therapeutic intervention in order to improve the following deficits and impairments:  Decreased strength, Impaired UE functional use, Postural dysfunction, Decreased mobility,  Pain  Visit Diagnosis: Acute shoulder pain due to trauma, right  Muscle weakness (generalized)  Chronic pain of right hip     Problem List There are no problems to display for this patient.   Christofer Shen, PT 12/22/2020, 12:07 PM  [facility] [address] [place], [place], [address] Phone: [phone]   Fax:  [phone]  Name: Alvin Johnson MRN: [id number] Date of Birth: 1985-07-04   Karie Mainland, PT 12/22/20 12:08 PM Phone: [phone] Fax: [phone]

## 2020-12-23 ENCOUNTER — Other Ambulatory Visit (HOSPITAL_COMMUNITY): Payer: Self-pay

## 2020-12-24 ENCOUNTER — Other Ambulatory Visit: Payer: Self-pay

## 2020-12-24 ENCOUNTER — Ambulatory Visit: Payer: PRIVATE HEALTH INSURANCE | Admitting: Physical Therapy

## 2020-12-24 DIAGNOSIS — G8929 Other chronic pain: Secondary | ICD-10-CM

## 2020-12-24 DIAGNOSIS — G8911 Acute pain due to trauma: Secondary | ICD-10-CM

## 2020-12-24 DIAGNOSIS — M25511 Pain in right shoulder: Secondary | ICD-10-CM | POA: Diagnosis not present

## 2020-12-24 DIAGNOSIS — M6281 Muscle weakness (generalized): Secondary | ICD-10-CM

## 2020-12-24 NOTE — Therapy (Signed)
[facility] Outpatient Rehabilitation [facility] [address] [place], [place], [address] Phone: [phone]   Fax:  [phone]  Physical Therapy Treatment  Patient Details  Name: Alvin Johnson MRN: [id number] Date of Birth: [birth year] Referring Provider (PT): Dr. Lucia Gaskins   Encounter Date: 12/24/2020   PT End of Session - 12/24/20 0946     Visit Number 3    Number of Visits 5    Date for PT Re-Evaluation 01/26/21    Authorization Type UMR    Authorization - Visit Number 3    Authorization - Number of Visits 5    PT Start Time 0935    PT Stop Time 1015    PT Time Calculation (min) 40 min    Activity Tolerance Patient tolerated treatment well    Behavior During Therapy [facility] for tasks assessed/performed             Past Medical History:  Diagnosis Date   GERD (gastroesophageal reflux disease)     Past Surgical History:  Procedure Laterality Date   KNEE ARTHROSCOPY Left    VASECTOMY     WISDOM TOOTH EXTRACTION      There were no vitals filed for this visit.   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    Currently in Pain? No/denies             [facility] Adult PT Treatment/Exercise:   Therapeutic Exercise: UBE   Pilates Reformer used for LE/core strength, postural strength, lumbopelvic disassociation and core control.  Exercises included:  Supine Arm work 1 Red 1 Yellow Arcs and triceps x 10 each   Quadruped 1 red UE press then added LE to plank x 10   Long box Prone pulling 1 red   Extension x 10 then horizontal abduction x 10  Cues needed for thoracic extension   Overhead press   Double and single 1 red  good form and lower trap activation  Kneeling Arms  1 long blue spring    Diagonal pull x 10   Flexion x 10  External rotation  x 10     PT Education - 12/24/20 0946     Education Details Pilates, scap stabilization and mobility     Person(s) Educated Patient    Methods Explanation    Comprehension Verbalized understanding                 PT Long Term Goals - 12/15/20 1540       PT LONG TERM GOAL #1   Title Pt will be Ind in a final HEP for Rt UE  to maintain or progress achieved LOF    Time 6    Period Weeks    Status New    Target Date 01/26/21      PT LONG TERM GOAL #2   Title Pt will be able to lift daughter without increased Rt shoulder pain    Time 6    Period Weeks    Status New    Target Date 01/26/21      PT LONG TERM GOAL #3   Title Pt will experience no pain with usual gym exercises, weights    Time 6    Period Weeks    Status New    Target Date 01/26/21      PT LONG TERM GOAL #4   Title Pt will be able  to cont to push, pull, lift with proper body mechanics > 50 lbs for safe work habits.    Time 6    Period Weeks    Status New    Target Date 01/27/21                   Plan - 12/24/20 1202     Clinical Impression Statement Patient challenged with closed chain stability in UEs using Pilates Reformer.  Noted asymmetry L scap sitting higher than Rt.  No pain during session.    PT Treatment/Interventions ADLs/Self Care Home Management;Cryotherapy;Patient/family education;Manual techniques;Dry needling;Taping;Passive range of motion;Therapeutic exercise;Moist Heat;Therapeutic activities;Other (comment)    PT Next Visit Plan check HEP, advance stability and mobilty of Rt UE: UBE, prone scapular work.  closed chain (Reformer)    PT Home Exercise Plan Access Code: [email or web address]    Consulted and Agree with Plan of Care Patient             Patient will benefit from skilled therapeutic intervention in order to improve the following deficits and impairments:  Decreased strength, Impaired UE functional use, Postural dysfunction, Decreased mobility, Pain  Visit Diagnosis: Acute shoulder pain due to trauma, right  Muscle weakness (generalized)  Chronic pain of right  hip     Problem List There are no problems to display for this patient.   Ewa Hipp, PT 12/24/2020, 12:04 PM  [facility] [address] [place], [place], [address] Phone: [phone]   Fax:  [phone]  Name: Alvin Johnson MRN: [id number] Date of Birth: [birth year]   Karie Mainland, PT 12/24/20 12:05 PM Phone: [phone] Fax: [phone]

## 2020-12-29 ENCOUNTER — Ambulatory Visit: Payer: PRIVATE HEALTH INSURANCE | Admitting: Physical Therapy

## 2020-12-29 ENCOUNTER — Telehealth: Payer: Self-pay | Admitting: Physical Therapy

## 2020-12-29 NOTE — Telephone Encounter (Signed)
Called patient regarding today's missed PT appt.  Left voicemail and reminded him of his upcoming appt this week.  Karie Mainland, PT 12/29/20 1:23 PM Phone: [phone] Fax: [phone]

## 2020-12-31 ENCOUNTER — Ambulatory Visit: Payer: PRIVATE HEALTH INSURANCE

## 2020-12-31 ENCOUNTER — Other Ambulatory Visit: Payer: Self-pay

## 2020-12-31 DIAGNOSIS — G8911 Acute pain due to trauma: Secondary | ICD-10-CM

## 2020-12-31 DIAGNOSIS — M25511 Pain in right shoulder: Secondary | ICD-10-CM | POA: Diagnosis not present

## 2020-12-31 DIAGNOSIS — M6281 Muscle weakness (generalized): Secondary | ICD-10-CM

## 2020-12-31 NOTE — Therapy (Signed)
[facility] Outpatient Rehabilitation [facility] [address] [place], [place], [address] Phone: [phone]   Fax:  [phone]  Physical Therapy Treatment  Patient Details  Name: Alvin Johnson MRN: [id number] Date of Birth: [birth year] Referring Provider (PT): Dr. Lucia Gaskins   Encounter Date: 12/31/2020   PT End of Session - 12/31/20 0958     Visit Number 4    Number of Visits 5    Date for PT Re-Evaluation 01/26/21    Authorization Type UMR    Authorization - Visit Number 4    Authorization - Number of Visits 5    PT Start Time 1000    PT Stop Time 1039    PT Time Calculation (min) 39 min    Activity Tolerance Patient tolerated treatment well    Behavior During Therapy [facility] for tasks assessed/performed             Past Medical History:  Diagnosis Date   GERD (gastroesophageal reflux disease)     Past Surgical History:  Procedure Laterality Date   KNEE ARTHROSCOPY Left    VASECTOMY     WISDOM TOOTH EXTRACTION      There were no vitals filed for this visit.   Subjective Assessment - 12/31/20 1000     Subjective Pt presents to PT with no current R shoulder, does promote some L shoulder stinging as well. Has been compliant with HEP with no adverse effect. Pt is ready to begin PT treatment at this time.    Currently in Pain? No/denies           [facility] Adult PT Treatment/Exercise:   Therapeutic Exercise: UBE lvl 2.0 x 4 min (8fwd/2bwd) while taking subjective Row 3x12 27lb Shoulder ext w/ scap retrac 2x10 27lb [place] 10lb KB x 190ft ea Standing bilat ER w/ scap retrac 3x10 GTB Standing horizontal abd 3x10 GTB ER pulse BTB 2x30" ea Body blade x 30" ea L and R ER/IR 90/90 Flext/ext pronated grip 90 deg 90 deg abd Birddog 2x10 - with crunch Cat Cow x 10 Serratus wall slide 2x10 YTB Total gym row 3x10 45lbs                                  PT Long Term Goals - 12/15/20 1540       PT LONG TERM  GOAL #1   Title Pt will be Ind in a final HEP for Rt UE  to maintain or progress achieved LOF    Time 6    Period Weeks    Status New    Target Date 01/26/21      PT LONG TERM GOAL #2   Title Pt will be able to lift daughter without increased Rt shoulder pain    Time 6    Period Weeks    Status New    Target Date 01/26/21      PT LONG TERM GOAL #3   Title Pt will experience no pain with usual gym exercises, weights    Time 6    Period Weeks    Status New    Target Date 01/26/21      PT LONG TERM GOAL #4   Title Pt will be able to cont to push, pull, lift with proper body mechanics > 50 lbs for safe work habits.    Time 6    Period Weeks    Status New  Target Date 01/27/21                   Plan - 12/31/20 1034     Clinical Impression Statement Pt able to complete prescribed exercises with no adverse effect. Therapy today focused on improving strength and endurance of shoulder stabilizers, includeing periscapular and RTC musculature. He continues to benefit to benefit from skilled PT services with review of goals, HEP, and possible d/c at next session.    PT Treatment/Interventions ADLs/Self Care Home Management;Cryotherapy;Patient/family education;Manual techniques;Dry needling;Taping;Passive range of motion;Therapeutic exercise;Moist Heat;Therapeutic activities;Other (comment)    PT Next Visit Plan check HEP, advance stability and mobilty of Rt UE: UBE, prone scapular work.  closed chain (Reformer)    PT Home Exercise Plan Access Code: [email or web address]             Patient will benefit from skilled therapeutic intervention in order to improve the following deficits and impairments:  Decreased strength, Impaired UE functional use, Postural dysfunction, Decreased mobility, Pain  Visit Diagnosis: Acute shoulder pain due to trauma, right  Muscle weakness (generalized)     Problem List There are no problems to display for this patient.   Eloy End,  PT 12/31/2020, 10:41 AM  [facility] [address] [place], [place], [address] Phone: [phone]   Fax:  [phone]  Name: DAINEL ARCIDIACONO MRN: [id number] Date of Birth: Apr 06, 1985

## 2021-01-07 ENCOUNTER — Other Ambulatory Visit: Payer: Self-pay

## 2021-01-07 ENCOUNTER — Ambulatory Visit: Payer: PRIVATE HEALTH INSURANCE

## 2021-01-07 DIAGNOSIS — G8911 Acute pain due to trauma: Secondary | ICD-10-CM

## 2021-01-07 DIAGNOSIS — M25511 Pain in right shoulder: Secondary | ICD-10-CM | POA: Diagnosis not present

## 2021-01-07 DIAGNOSIS — M6281 Muscle weakness (generalized): Secondary | ICD-10-CM

## 2021-01-07 DIAGNOSIS — G8929 Other chronic pain: Secondary | ICD-10-CM

## 2021-01-07 NOTE — Therapy (Addendum)
[place], [place], [address] Phone: [phone]   Fax:  [phone]  Physical Therapy Treatment/Discharge  Patient Details  Name: Alvin Johnson MRN: [id number] Date of Birth: [birth year] Referring Provider (PT): Dr. Dannial Monarch   Encounter Date: 01/07/2021   PT End of Session - 01/07/21 1425     Visit Number 5    Number of Visits 5    Date for PT Re-Evaluation 01/26/21    Authorization Type UMR    Authorization - Visit Number 5    Authorization - Number of Visits 5    PT Start Time 6503    PT Stop Time 1500    PT Time Calculation (min) 40 min    Activity Tolerance Patient tolerated treatment well    Behavior During Therapy [facility] for tasks assessed/performed             Past Medical History:  Diagnosis Date   GERD (gastroesophageal reflux disease)     Past Surgical History:  Procedure Laterality Date   KNEE ARTHROSCOPY Left    VASECTOMY     WISDOM TOOTH EXTRACTION      There were no vitals filed for this visit.   Subjective Assessment - 01/07/21 1434     Subjective Pt report his R shoulder is doing well. Pt notes going to the gym and completing his PT workout there and tolerated it well.    Patient Stated Goals Prevent further injury    Currently in Pain? No/denies    Pain Location Shoulder    Pain Orientation Right                             [facility] Adult PT Treatment/Exercise:   Therapeutic Exercise: UBE lvl 2.0 x 4 min (35fd/2bwd) while taking subjective Row 3x12 27lb Shoulder ext w/ scap retrac 2x10 27lb [place] 10lb KB x [id number] Standing bilat ER w/ scap retrac 3x10 GTB Standing horizontal abd 3x10 GTB ER pulse BTB 2x30" ea Body blade x 30" ea L and R ER/IR 90/90 Flext/ext pronated grip 90 deg 90 deg abd [place]og 2x10 - with crunch Cat Cow x 10 Serratus wall slide 2x10 RTB Total gym row 3x10 45lbs                PT Education -  01/08/21 0511     Education Details Final HEP finalized    Person(s) Educated Patient    Methods Demonstration;Explanation    Comprehension Verbalized understanding;Returned demonstration                 PT Long Term Goals - 01/08/21 0512       PT LONG TERM GOAL #1   Title Pt will be Ind in a final HEP for Rt UE  to maintain or progress achieved LOF    Status Achieved    Target Date 01/07/21      PT LONG TERM GOAL #2   Title Pt will be able to lift daughter without increased Rt shoulder pain    Baseline reported no pain with this over the last 2 weeks    Status Achieved    Target Date 01/07/21      PT LONG TERM GOAL #3   Title Pt will experience no pain with usual gym exercises, weights    Status Achieved    Target Date 01/07/21      PT LONG TERM GOAL #4  Title Pt will be able to cont to push, pull, lift with proper body mechanics > 50 lbs for safe work habits.    Status Achieved    Target Date 01/07/21                   Plan - 01/07/21 1502     Clinical Impression Statement Pt presents to to his final PT session reporting he tolerated completing his PT rehab program in his own gym. Pt reports no issues with R shoulder pain with his work duties and activities at home. Pt has met all of his PT goals. Pt is Ind with a HEP to maintain/progress achieved LOF in PT. Pt is DCed from PT services    Examination-Activity Limitations Carry;Lift;Reach Overhead;Caring for Others    Examination-Participation Restrictions Community Activity;Occupation    Stability/Clinical Decision Making Stable/Uncomplicated    Clinical Decision Making Low    Rehab Potential Excellent    PT Frequency 2x / week    PT Duration 4 weeks    PT Treatment/Interventions ADLs/Self Care Home Management;Cryotherapy;Patient/family education;Manual techniques;Dry needling;Taping;Passive range of motion;Therapeutic exercise;Moist Heat;Therapeutic activities;Other (comment)              Patient will benefit from skilled therapeutic intervention in order to improve the following deficits and impairments:  Decreased strength, Impaired UE functional use, Postural dysfunction, Decreased mobility, Pain  Visit Diagnosis: Acute shoulder pain due to trauma, right  Muscle weakness (generalized)  Chronic pain of right hip     Problem List There are no problems to display for this patient.   PHYSICAL THERAPY DISCHARGE SUMMARY  Visits from Start of Care: 5  Current functional level related to goals / functional outcomes: See above   Remaining deficits: See above   Education / Equipment: HEP   Patient agrees to discharge. Patient goals were met. Patient is being discharged due to being pleased with the current functional level.  Gar Ponto MS, PT 01/08/21 5:24 AM   [facility] [address] [place], [place], [address] Phone: [phone]   Fax:  [phone]  Name: Alvin Johnson MRN: [id number] Date of Birth: January 05, 1986

## 2021-01-08 ENCOUNTER — Other Ambulatory Visit (HOSPITAL_COMMUNITY): Payer: Self-pay

## 2021-06-03 ENCOUNTER — Other Ambulatory Visit (HOSPITAL_COMMUNITY): Payer: Self-pay

## 2021-06-03 MED ORDER — AMOXICILLIN-POT CLAVULANATE 875-125 MG PO TABS
1.0000 | ORAL_TABLET | Freq: Two times a day (BID) | ORAL | 0 refills | Status: DC
  Filled 2021-06-03: qty 20, 10d supply, fill #0

## 2021-08-03 ENCOUNTER — Encounter: Payer: Self-pay | Admitting: Internal Medicine

## 2021-10-18 ENCOUNTER — Ambulatory Visit
Admission: EM | Admit: 2021-10-18 | Discharge: 2021-10-18 | Disposition: A | Discharge status: Home / Self Care | Payer: No Typology Code available for payment source | Attending: Internal Medicine | Admitting: Internal Medicine

## 2021-10-18 ENCOUNTER — Encounter: Payer: Self-pay | Admitting: Emergency Medicine

## 2021-10-18 DIAGNOSIS — B349 Viral infection, unspecified: Secondary | ICD-10-CM | POA: Insufficient documentation

## 2021-10-18 DIAGNOSIS — Z1152 Encounter for screening for COVID-19: Secondary | ICD-10-CM | POA: Diagnosis not present

## 2021-10-18 DIAGNOSIS — R110 Nausea: Secondary | ICD-10-CM | POA: Diagnosis not present

## 2021-10-18 MED ORDER — CETIRIZINE HCL 10 MG PO TABS: 10.0000 mg | ORAL_TABLET | Freq: Every day | ORAL | 0 refills | Status: AC

## 2021-10-18 MED ORDER — BENZONATATE 100 MG PO CAPS: 100.0000 mg | ORAL_CAPSULE | Freq: Three times a day (TID) | ORAL | 0 refills | Status: AC | PRN

## 2021-10-18 MED ORDER — FLUTICASONE PROPIONATE 50 MCG/ACT NA SUSP: 1.0000 | Freq: Every day | NASAL | 0 refills | Status: AC

## 2021-10-18 MED ORDER — ONDANSETRON 4 MG PO TBDP: 4.0000 mg | ORAL_TABLET | Freq: Three times a day (TID) | ORAL | 0 refills | Status: AC | PRN

## 2021-10-18 NOTE — ED Triage Notes (Signed)
Pt is present today with neck stiffness, nausea, chills, HA, body aches, and cough. Pt sx started x4 days ago

## 2021-10-18 NOTE — Discharge Instructions (Signed)
It appears that you have a viral illness that should run its course and self resolve with symptomatic treatment as we discussed.  I have prescribed you four medications including a nausea medication that you take as needed.  Please ensure rest and adequate fluid hydration.  You may follow-up if symptoms persist or worsen.

## 2021-10-18 NOTE — ED Provider Notes (Signed)
[facility]    CSN: [id number] Arrival date & time: 10/18/21  1520      History   Chief Complaint Chief Complaint  Patient presents with   Chills   Generalized Body Aches   Cough   Headache    HPI Alvin Johnson is a 36 y.o. male.   Patient presents with nausea without vomiting, chills, headache, generalized body aches, cough, nasal congestion that started 6 days ago.  Patient also reports that he has some neck stiffness but is attributing this to recent travel in a car.  Denies any recent injury to the neck.  Children currently have similar symptoms.  Patient does report that his in-laws tested positive for COVID about 2 to 3 weeks ago as well.  Patient does not report taking medications for pain or for symptoms.  Denies any known fevers.  Denies chest pain or shortness of breath as well.  Denies diarrhea or abdominal pain.   Cough Headache   Past Medical History:  Diagnosis Date   GERD (gastroesophageal reflux disease)     There are no problems to display for this patient.   Past Surgical History:  Procedure Laterality Date   KNEE ARTHROSCOPY Left    VASECTOMY     WISDOM TOOTH EXTRACTION         Home Medications    Prior to Admission medications   Medication Sig Start Date End Date Taking? Authorizing Provider  benzonatate (TESSALON) 100 MG capsule Take 1 capsule (100 mg total) by mouth every 8 (eight) hours as needed for cough. 10/18/21  Yes Aidenjames Heckmann, Acie Fredrickson, FNP  cetirizine (ZYRTEC) 10 MG tablet Take 1 tablet (10 mg total) by mouth daily. 10/18/21  Yes Jasenia Weilbacher, Rolly Salter E, FNP  fluticasone (FLONASE) 50 MCG/ACT nasal spray Place 1 spray into both nostrils daily for 3 days. 10/18/21 10/21/21 Yes Nachelle Negrette, Rolly Salter E, FNP  ondansetron (ZOFRAN-ODT) 4 MG disintegrating tablet Take 1 tablet (4 mg total) by mouth every 8 (eight) hours as needed for nausea or vomiting. 10/18/21  Yes Adhira Jamil, Rolly Salter E, FNP  Ascorbic Acid (VITAMIN C) 1000 MG tablet 1 tablet    [provider]  GLUTAMINE PO Take 1 tablet by mouth daily.    [provider]  ibuprofen (ADVIL) 800 MG tablet Take 1 tablet (800 mg total) by mouth 3 (three) times daily. 05/25/20   Wieters, Hallie C, PA-C  meloxicam (MOBIC) 15 MG tablet 1 tablet 10/22/19   [provider]  Multiple Vitamins-Minerals (MENS MULTIVITAMIN PLUS) TABS Take 1 tablet by mouth daily.    [provider]  naproxen (NAPROSYN) 500 MG tablet Take 1 tablet (500 mg total) by mouth 2 (two) times daily with a meal. 12/22/19   Jarold Motto, PA  pantoprazole (PROTONIX) 40 MG tablet Take 1 tablet (40 mg total) by mouth daily 30 minutes before dinner 12/15/20     pantoprazole (PROTONIX) 40 MG tablet Take 1 tablet (40 mg total) by mouth daily 30 minutes before dinner 12/16/20     Zinc 50 MG TABS 1 tablet    [provider]  omeprazole (PRILOSEC) 20 MG capsule Take 1 capsule (20 mg total) by mouth daily. Patient not taking: Reported on 11/30/2019 06/21/18 05/25/20  Georgetta Haber, NP    Family History Family History  Problem Relation Age of Onset   Hyperlipidemia Father    Hypertension Father    CAD Father    Colon polyps Father    Heart disease Father  CAD Paternal Grandfather    Heart disease Paternal Grandfather    Diabetes Paternal Grandfather    Colon cancer Paternal Grandmother    Pancreatic cancer Paternal Aunt    Esophageal cancer Neg Hx    Stomach cancer Neg Hx    Rectal cancer Neg Hx     Social History Social History   Tobacco Use   Smoking status: Never   Smokeless tobacco: Never  Vaping Use   Vaping Use: Never used  Substance Use Topics   Alcohol use: Yes    Alcohol/week: 0.0 standard drinks of alcohol    Comment: 1 beer every 3-4 months   Drug use: No     Allergies   Patient has no known allergies.   Review of Systems Review of Systems Per HPI  Physical Exam Triage Vital Signs ED Triage Vitals  Enc Vitals Group     BP 10/18/21 1527 129/78      Pulse Rate 10/18/21 1527 95     Resp 10/18/21 1527 17     Temp 10/18/21 1527 98.7 F (37.1 C)     Temp Source 10/18/21 1527 Oral     SpO2 10/18/21 1527 99 %     Weight --      Height --      Head Circumference --      Peak Flow --      Pain Score 10/18/21 1530 5     Pain Loc --      Pain Edu? --      Excl. in GC? --    No data found.  Updated Vital Signs BP 129/78   Pulse 95   Temp 98.7 F (37.1 C) (Oral)   Resp 17   SpO2 99%   Visual Acuity Right Eye Distance:   Left Eye Distance:   Bilateral Distance:    Right Eye Near:   Left Eye Near:    Bilateral Near:     Physical Exam Constitutional:      General: He is not in acute distress.    Appearance: Normal appearance. He is not toxic-appearing or diaphoretic.  HENT:     Head: Normocephalic and atraumatic.     Right Ear: Ear canal normal. No drainage, swelling or tenderness. A middle ear effusion is present. Tympanic membrane is not perforated, erythematous or bulging.     Left Ear: Ear canal normal. No drainage, swelling or tenderness. A middle ear effusion is present. Tympanic membrane is not perforated, erythematous or bulging.     Nose: Congestion present.     Mouth/Throat:     Mouth: Mucous membranes are moist.     Pharynx: No posterior oropharyngeal erythema.  Eyes:     Extraocular Movements: Extraocular movements intact.     Conjunctiva/sclera: Conjunctivae normal.     Pupils: Pupils are equal, round, and reactive to light.  Cardiovascular:     Rate and Rhythm: Normal rate and regular rhythm.     Pulses: Normal pulses.     Heart sounds: Normal heart sounds.  Pulmonary:     Effort: Pulmonary effort is normal. No respiratory distress.     Breath sounds: Normal breath sounds. No stridor. No wheezing, rhonchi or rales.  Abdominal:     General: Abdomen is flat. Bowel sounds are normal.     Palpations: Abdomen is soft.  Musculoskeletal:        General: Normal range of motion.     Cervical back: Full passive  range of motion without pain and  normal range of motion. No edema or erythema. No pain with movement, spinous process tenderness or muscular tenderness.  Lymphadenopathy:     Cervical: No cervical adenopathy.  Skin:    General: Skin is warm and dry.  Neurological:     General: No focal deficit present.     Mental Status: He is alert and oriented to person, place, and time. Mental status is at baseline.  Psychiatric:        Mood and Affect: Mood normal.        Behavior: Behavior normal.      UC Treatments / Results  Labs (all labs ordered are listed, but only abnormal results are displayed) Labs Reviewed  RESP PANEL BY RT-PCR (FLU A&B, COVID) ARPGX2    EKG   Radiology No results found.  Procedures Procedures (including critical care time)  Medications Ordered in UC Medications - No data to display  Initial Impression / Assessment and Plan / UC Course  I have reviewed the triage vital signs and the nursing notes.  Pertinent labs & imaging results that were available during my care of the patient were reviewed by me and considered in my medical decision making (see chart for details).     Patient presents with symptoms likely from a viral upper respiratory infection. Differential includes bacterial pneumonia, sinusitis, allergic rhinitis, covid 19, flu, RSV. Do not suspect underlying cardiopulmonary process. Symptoms seem unlikely related to ACS, CHF or COPD exacerbations, pneumonia, pneumothorax. Patient is nontoxic appearing and not in need of emergent medical intervention.  COVID and flu testing pending.  Physical exam of neck is benign.  Could be musculoskeletal in nature.  There is no concern for meningitis on exam.  Recommended symptom control with over the counter medications: Daily oral anti-histamine, Oral decongestant or IN corticosteroid, saline irrigations, cepacol lozenges, Robitussin, Delsym, honey tea.  Patient sent prescriptions to help alleviate  symptoms.  Return if symptoms fail to improve in 1-2 weeks or you develop shortness of breath, chest pain, severe headache. Patient states understanding and is agreeable.  Discharged with PCP followup.  Final Clinical Impressions(s) / UC Diagnoses   Final diagnoses:  Viral illness  Nausea without vomiting     Discharge Instructions      It appears that you have a viral illness that should run its course and self resolve with symptomatic treatment as we discussed.  I have prescribed you four medications including a nausea medication that you take as needed.  Please ensure rest and adequate fluid hydration.  You may follow-up if symptoms persist or worsen.    ED Prescriptions     Medication Sig Dispense Auth. Provider   ondansetron (ZOFRAN-ODT) 4 MG disintegrating tablet Take 1 tablet (4 mg total) by mouth every 8 (eight) hours as needed for nausea or vomiting. 20 tablet [place], [place] E, [place]   cetirizine (ZYRTEC) 10 MG tablet Take 1 tablet (10 mg total) by mouth daily. 30 tablet [place], [place] E, [place]   fluticasone [facility]) 50 MCG/ACT nasal spray Place 1 spray into both nostrils daily for 3 days. 16 g Keyana Guevara, Hildred Alamin E, [place]   benzonatate (TESSALON) 100 MG capsule Take 1 capsule (100 mg total) by mouth every 8 (eight) hours as needed for cough. 21 capsule [place], Michele Rockers, [place]      PDMP not reviewed this encounter.   Teodora Medici, [place] 10/18/21 [phone]

## 2021-10-19 LAB — RESP PANEL BY RT-PCR (FLU A&B, COVID) ARPGX2
Influenza A by PCR: NEGATIVE
Influenza B by PCR: NEGATIVE
SARS Coronavirus 2 by RT PCR: NEGATIVE

## 2021-10-20 ENCOUNTER — Other Ambulatory Visit (HOSPITAL_COMMUNITY): Payer: Self-pay

## 2021-10-20 MED ORDER — AZITHROMYCIN 250 MG PO TABS
ORAL_TABLET | ORAL | 0 refills | Status: AC
  Filled 2021-10-20: qty 6, 5d supply, fill #0

## 2021-10-20 MED ORDER — NAPROXEN 500 MG PO TABS
500.0000 mg | ORAL_TABLET | Freq: Two times a day (BID) | ORAL | 0 refills | Status: AC
  Filled 2021-10-20: qty 14, 7d supply, fill #0

## 2021-12-18 IMAGING — MR MR HIP*R* W/O CM
6 series · 40 of 40 positions shown · non-contrast
Comparison: None.

CLINICAL DATA: Right hip pain after a related raise 5 weeks ago.

EXAM:
MR OF THE RIGHT HIP WITHOUT CONTRAST
TECHNIQUE: Multiplanar, multisequence MR imaging was performed. No intravenous
contrast was administered.

[Series 9: T2 fat-sat · coronal · right · 3.0mm · 1.25mm/px · 8 of 45 slices shown (1 of 2)]
[im 1/45]
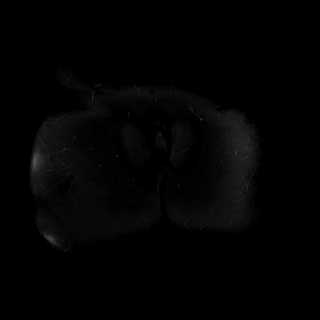
[im 7/45]
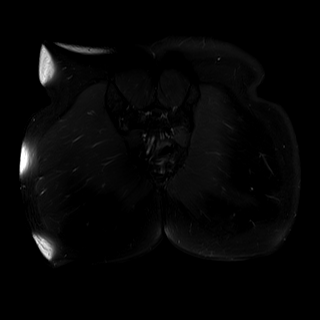
[im 13/45]
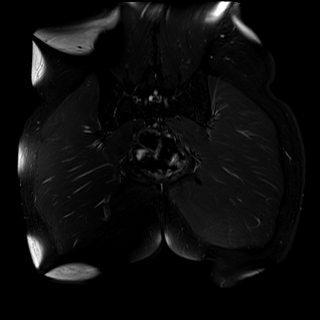
[im 19/45]
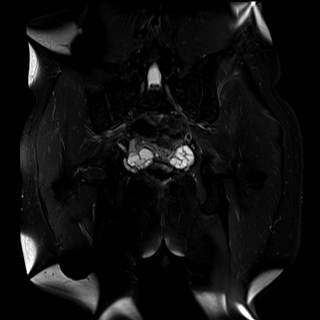
[im 26/45]
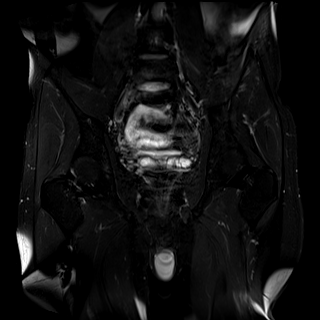
[im 32/45]
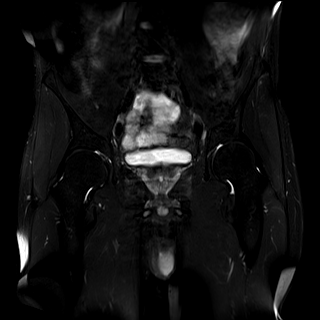
[im 38/45]
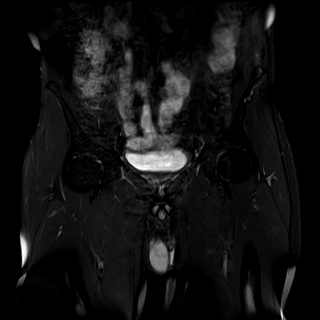
[im 45/45]
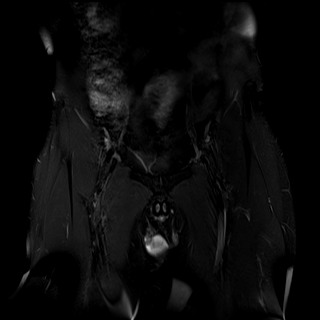

[Series 10: T1 · coronal · right · 3.0mm · 1.25mm/px · 8 of 45 slices shown]
[im 1/45]
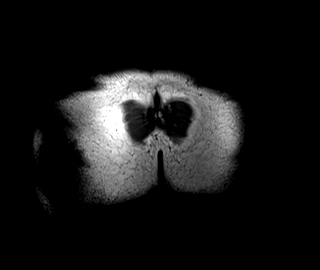
[im 7/45]
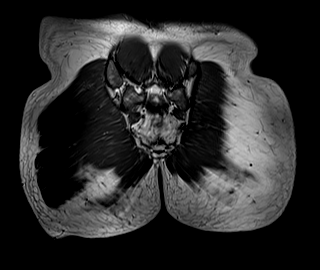
[im 13/45]
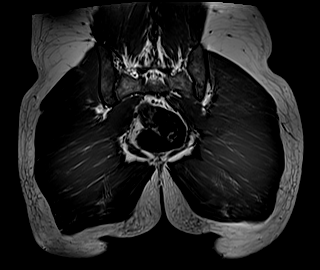
[im 19/45]
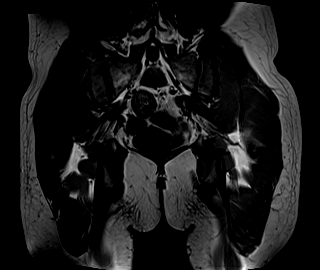
[im 26/45]
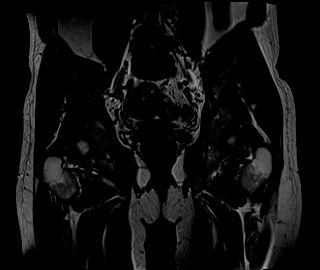
[im 32/45]
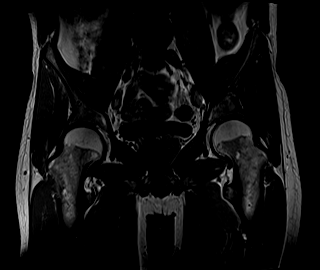
[im 38/45]
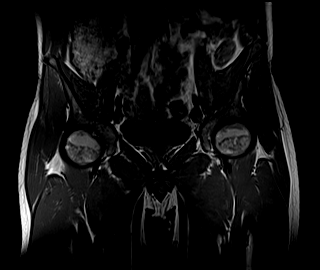
[im 45/45]
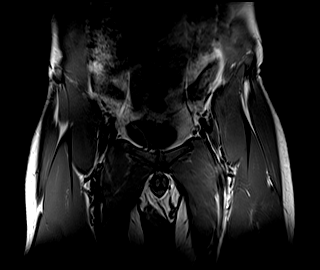

[Series 11: PD fat-sat · coronal · right · 3.0mm · 0.56mm/px · 6 of 33 slices shown (1 of 2)]
[im 1/33]
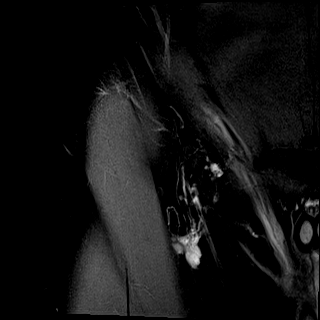
[im 7/33]
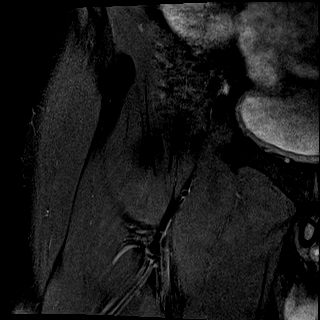
[im 13/33]
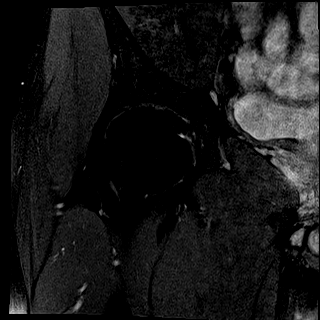
[im 20/33]
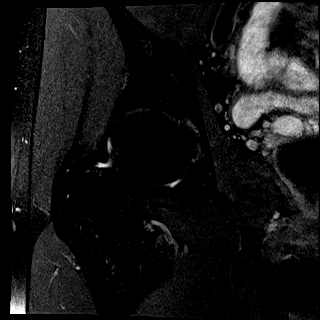
[im 26/33]
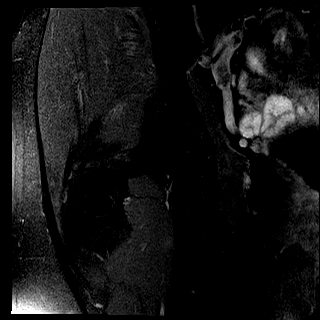
[im 33/33]
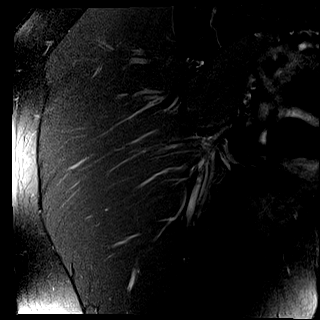

[Series 12: PD fat-sat · sagittal · right · 3.0mm · 0.56mm/px · 6 of 35 slices shown (2 of 2)]
[im 1/35]
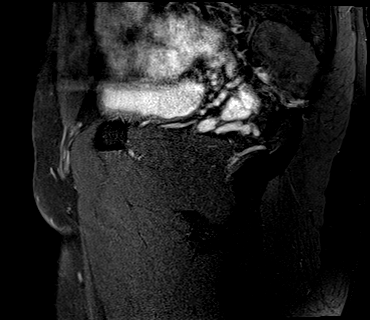
[im 7/35]
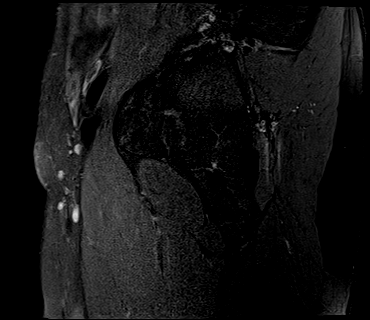
[im 14/35]
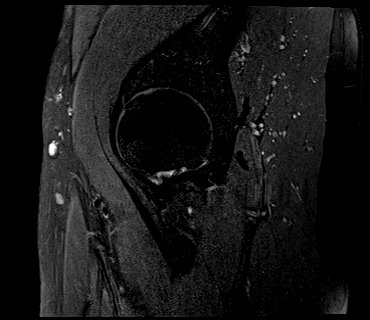
[im 21/35]
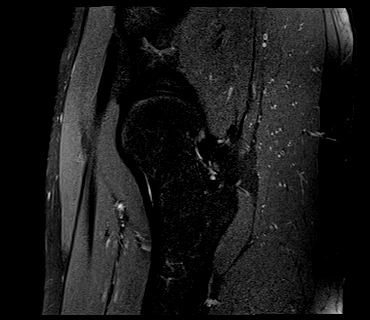
[im 28/35]
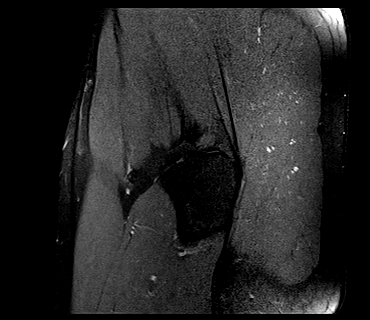
[im 35/35]
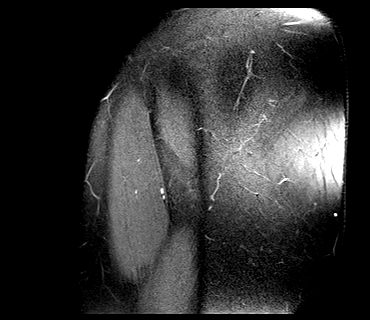

[Series 13: T2 fat-sat · axial · right · 5.0mm · 0.78mm/px · z∈[-95,+136]mm · 6 of 34 slices shown (2 of 2)]
[im 1/34]
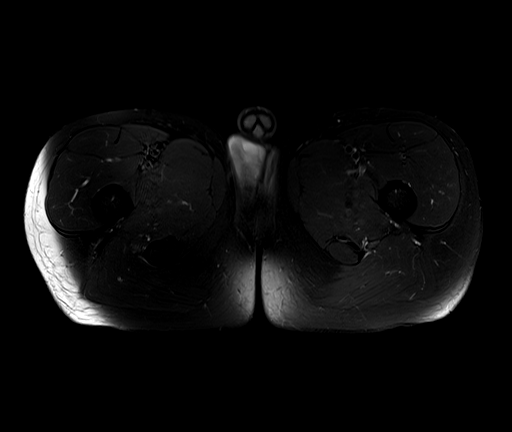
[im 7/34]
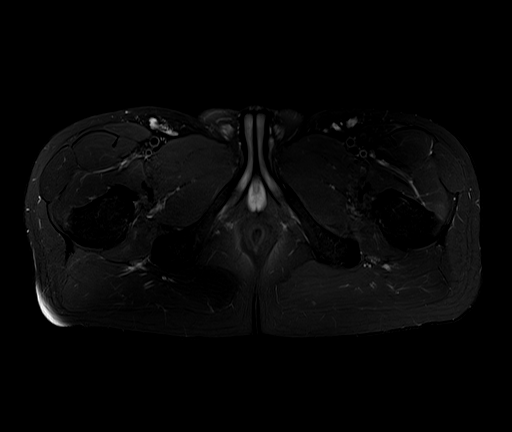
[im 14/34]
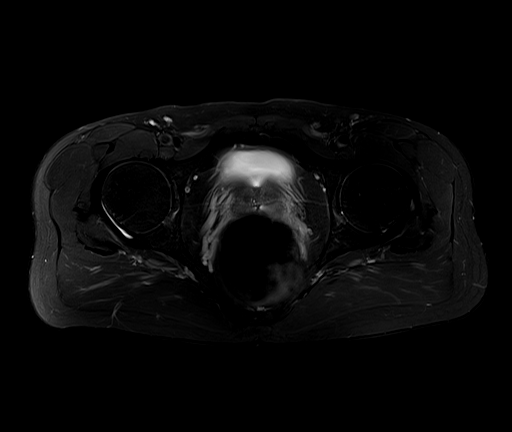
[im 20/34]
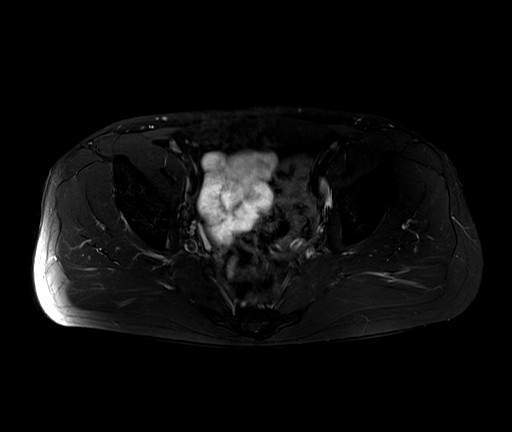
[im 27/34]
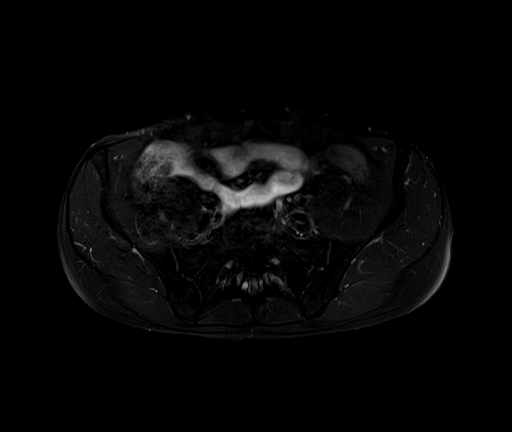
[im 34/34]
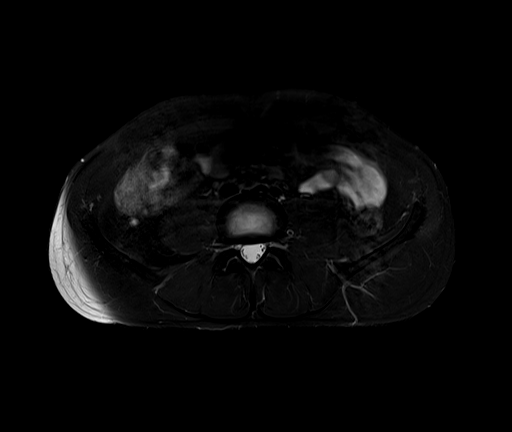

[Series 14: STIR · axial · right · 5.0mm · 1.25mm/px · z∈[-95,+136]mm · 6 of 34 slices shown]
[im 1/34]
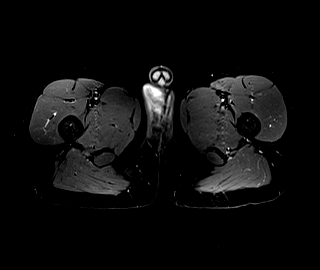
[im 7/34]
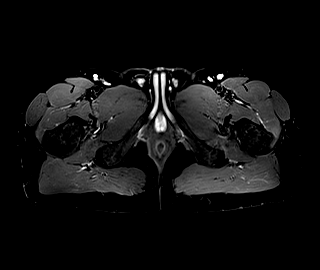
[im 14/34]
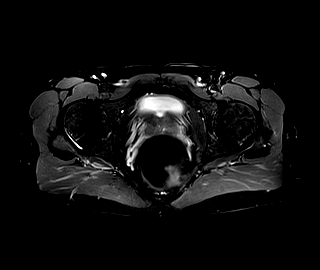
[im 20/34]
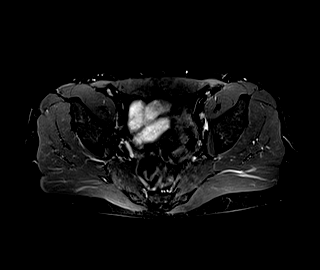
[im 27/34]
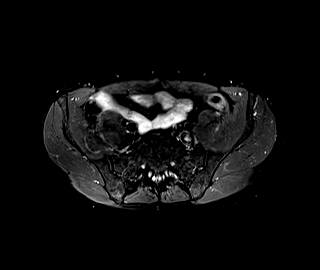
[im 34/34]
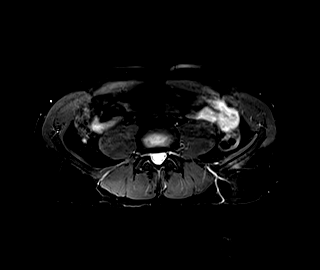

[40 of 40 positions shown; findings below may reference images not displayed]

FINDINGS: Bones:

No hip fracture, dislocation or avascular necrosis. No periosteal
reaction or bone destruction. No aggressive osseous lesion.

Normal sacrum and sacroiliac joints. No SI joint widening or erosive
changes.

Articular cartilage and labrum

Articular cartilage:  No chondral defect.

Labrum:  Right superior anterior labral tear.

Joint or bursal effusion

Joint effusion:  No hip joint effusion.  No SI joint effusion.

Bursae:  No bursa formation.

Muscles and tendons

Flexors: Normal.

Extensors: Normal.

Abductors: Normal.

Adductors: Normal.

Gluteals: Normal.

Hamstrings: Normal.

Other findings

No pelvic free fluid. No fluid collection or hematoma. No inguinal
lymphadenopathy. No inguinal hernia.
IMPRESSION: 1. Right superior anterior labral tear.  No chondral abnormality.
2. No hip fracture, dislocation or avascular necrosis.

## 2021-12-29 ENCOUNTER — Ambulatory Visit: Admit: 2021-12-29 | Discharge status: Against Medical Advice | Payer: No Typology Code available for payment source

## 2022-04-05 ENCOUNTER — Other Ambulatory Visit (HOSPITAL_COMMUNITY): Payer: Self-pay

## 2022-04-05 DIAGNOSIS — M25531 Pain in right wrist: Secondary | ICD-10-CM | POA: Diagnosis not present

## 2022-04-05 MED ORDER — NAPROXEN 500 MG PO TABS
500.0000 mg | ORAL_TABLET | Freq: Two times a day (BID) | ORAL | 0 refills | Status: AC | PRN
  Filled 2022-04-05: qty 20, 10d supply, fill #0

## 2022-06-09 ENCOUNTER — Other Ambulatory Visit (HOSPITAL_COMMUNITY): Payer: Self-pay

## 2022-06-09 DIAGNOSIS — A084 Viral intestinal infection, unspecified: Secondary | ICD-10-CM | POA: Diagnosis not present

## 2022-06-14 DIAGNOSIS — K529 Noninfective gastroenteritis and colitis, unspecified: Secondary | ICD-10-CM | POA: Diagnosis not present

## 2022-06-15 DIAGNOSIS — K529 Noninfective gastroenteritis and colitis, unspecified: Secondary | ICD-10-CM | POA: Diagnosis not present

## 2022-07-20 ENCOUNTER — Other Ambulatory Visit (HOSPITAL_COMMUNITY): Payer: Self-pay

## 2022-07-20 MED ORDER — PANTOPRAZOLE SODIUM 40 MG PO TBEC
40.0000 mg | DELAYED_RELEASE_TABLET | Freq: Every day | ORAL | 0 refills | Status: AC
  Filled 2022-07-20: qty 90, 90d supply, fill #0

## 2022-08-12 ENCOUNTER — Other Ambulatory Visit (HOSPITAL_COMMUNITY): Payer: Self-pay

## 2022-08-12 DIAGNOSIS — M5432 Sciatica, left side: Secondary | ICD-10-CM | POA: Diagnosis not present

## 2022-08-12 MED ORDER — PREDNISONE 20 MG PO TABS
ORAL_TABLET | ORAL | 0 refills | Status: AC
  Filled 2022-08-12: qty 18, 9d supply, fill #0

## 2022-08-20 DIAGNOSIS — M5416 Radiculopathy, lumbar region: Secondary | ICD-10-CM | POA: Diagnosis not present

## 2022-08-30 DIAGNOSIS — M5416 Radiculopathy, lumbar region: Secondary | ICD-10-CM | POA: Diagnosis not present

## 2022-09-17 ENCOUNTER — Ambulatory Visit: Payer: 59 | Admitting: Podiatry

## 2022-09-17 DIAGNOSIS — Q828 Other specified congenital malformations of skin: Secondary | ICD-10-CM | POA: Diagnosis not present

## 2022-09-17 DIAGNOSIS — M5416 Radiculopathy, lumbar region: Secondary | ICD-10-CM | POA: Diagnosis not present

## 2022-09-17 DIAGNOSIS — Q666 Other congenital valgus deformities of feet: Secondary | ICD-10-CM

## 2022-09-17 NOTE — Progress Notes (Signed)
Subjective:  Patient ID: Alvin Johnson, male    DOB: [birth year],  MRN: [id number]  Chief Complaint  Patient presents with   Callouses    37 y.o. male presents with the above complaint.  Patient presents with bilateral submetatarsal 5 porokeratotic lesion painful to touch is progressive gotten worse worse with ambulation or shoe pressure he has not seen anyone as prior to seeing me.  He wanted to discuss treatment options does not wear any orthotics pain scale is 5 out of 10 dull achy in nature hurts with going barefooted.   Review of Systems: Negative except as noted in the HPI. Denies N/V/F/Ch.  Past Medical History:  Diagnosis Date   GERD (gastroesophageal reflux disease)     Current Outpatient Medications:    Ascorbic Acid (VITAMIN C) 1000 MG tablet, 1 tablet, Disp: , Rfl:    benzonatate (TESSALON) 100 MG capsule, Take 1 capsule (100 mg total) by mouth every 8 (eight) hours as needed for cough., Disp: 21 capsule, Rfl: 0   cetirizine (ZYRTEC) 10 MG tablet, Take 1 tablet (10 mg total) by mouth daily., Disp: 30 tablet, Rfl: 0   fluticasone (FLONASE) 50 MCG/ACT nasal spray, Place 1 spray into both nostrils daily for 3 days., Disp: 16 g, Rfl: 0   GLUTAMINE PO, Take 1 tablet by mouth daily., Disp: , Rfl:    ibuprofen (ADVIL) 800 MG tablet, Take 1 tablet (800 mg total) by mouth 3 (three) times daily., Disp: 21 tablet, Rfl: 0   meloxicam (MOBIC) 15 MG tablet, 1 tablet, Disp: , Rfl:    Multiple Vitamins-Minerals (MENS MULTIVITAMIN PLUS) TABS, Take 1 tablet by mouth daily., Disp: , Rfl:    naproxen (NAPROSYN) 500 MG tablet, Take 1 tablet (500 mg total) by mouth 2 (two) times daily with a meal., Disp: 30 tablet, Rfl: 0   naproxen (NAPROSYN) 500 MG tablet, Take 1 tablet (500 mg total) by mouth every 12 (twelve) hours with food or milk as needed for headache, Disp: 14 tablet, Rfl: 0   naproxen (NAPROSYN) 500 MG tablet, Take 1 tablet (500 mg total) by mouth every 12 (twelve) hours with food or  milk as needed for 10 days, Disp: 20 tablet, Rfl: 0   ondansetron (ZOFRAN-ODT) 4 MG disintegrating tablet, Take 1 tablet (4 mg total) by mouth every 8 (eight) hours as needed for nausea or vomiting., Disp: 20 tablet, Rfl: 0   pantoprazole (PROTONIX) 40 MG tablet, Take 1 tablet (40 mg total) by mouth daily 30 minutes before dinner, Disp: 30 tablet, Rfl: 0   pantoprazole (PROTONIX) 40 MG tablet, Take 1 tablet (40 mg total) by mouth daily 30 minutes before dinner, Disp: 30 tablet, Rfl: 1   pantoprazole (PROTONIX) 40 MG tablet, Take 1 tablet (40 mg total) by mouth daily., Disp: 90 tablet, Rfl: 0   Zinc 50 MG TABS, 1 tablet, Disp: , Rfl:   Social History   Tobacco Use  Smoking Status Never  Smokeless Tobacco Never    No Known Allergies Objective:  There were no vitals filed for this visit. There is no height or weight on file to calculate BMI. Constitutional Well developed. Well nourished.  Vascular Dorsalis pedis pulses palpable bilaterally. Posterior tibial pulses palpable bilaterally. Capillary refill normal to all digits.  No cyanosis or clubbing noted. Pedal hair growth normal.  Neurologic Normal speech. Oriented to person, place, and time. Epicritic sensation to light touch grossly present bilaterally.  Dermatologic Nails well groomed and normal in appearance. No open wounds. No  skin lesions.  Orthopedic: Bilateral submetatarsal 5 hyperkeratotic lesion painful to touch underlying gait examination noted to be pes planovalgus deformity with calcaneovalgus to many toe signs partially but recurred the arch with dorsiflexion of the hallux unable to perform single and double heel raise.   Radiographs: None Assessment:   1. Porokeratosis   2. Pes planovalgus    Plan:  Patient was evaluated and treated and all questions answered.  Bilateral submetatarsal 5 porokeratosis -All questions and concerns were discussed with the patient in extensive detail -Given the amount of pain that  he is having he will benefit from debridement of the lesion using chisel blade handle as a courtesy the lesion was debrided down to healthy dry tissue.  No complication noted no pinpoint bleeding noted.  Pes planovalgus -I explained to patient the etiology of pes planovalgus and relationship with Planter fasciitis and various treatment options were discussed.  Given patient foot structure in the setting of Planter fasciitis I believe patient will benefit from custom-made orthotics to help control the hindfoot motion support the arch of the foot and take the stress away from plantar fascial.  Patient agrees with the plan like to proceed with orthotics -Patient was casted for orthotics with offloading of submetatarsal 5

## 2022-09-29 DIAGNOSIS — R519 Headache, unspecified: Secondary | ICD-10-CM | POA: Diagnosis not present

## 2022-10-27 ENCOUNTER — Other Ambulatory Visit (HOSPITAL_COMMUNITY): Payer: Self-pay

## 2022-10-27 DIAGNOSIS — R300 Dysuria: Secondary | ICD-10-CM | POA: Diagnosis not present

## 2022-10-27 MED ORDER — PHENAZOPYRIDINE HCL 200 MG PO TABS
200.0000 mg | ORAL_TABLET | Freq: Three times a day (TID) | ORAL | 0 refills | Status: AC | PRN
  Filled 2022-10-27: qty 6, 2d supply, fill #0

## 2022-11-03 DIAGNOSIS — M5416 Radiculopathy, lumbar region: Secondary | ICD-10-CM | POA: Diagnosis not present

## 2022-11-03 DIAGNOSIS — K219 Gastro-esophageal reflux disease without esophagitis: Secondary | ICD-10-CM | POA: Diagnosis not present

## 2022-11-03 DIAGNOSIS — R519 Headache, unspecified: Secondary | ICD-10-CM | POA: Diagnosis not present

## 2022-11-03 DIAGNOSIS — Z0000 Encounter for general adult medical examination without abnormal findings: Secondary | ICD-10-CM | POA: Diagnosis not present

## 2022-11-03 DIAGNOSIS — M25512 Pain in left shoulder: Secondary | ICD-10-CM | POA: Diagnosis not present

## 2022-11-03 DIAGNOSIS — Z13220 Encounter for screening for lipoid disorders: Secondary | ICD-10-CM | POA: Diagnosis not present

## 2022-11-03 DIAGNOSIS — G8929 Other chronic pain: Secondary | ICD-10-CM | POA: Diagnosis not present

## 2022-11-12 DIAGNOSIS — M5416 Radiculopathy, lumbar region: Secondary | ICD-10-CM | POA: Diagnosis not present

## 2022-11-22 DIAGNOSIS — M25512 Pain in left shoulder: Secondary | ICD-10-CM | POA: Diagnosis not present

## 2022-11-30 DIAGNOSIS — M5416 Radiculopathy, lumbar region: Secondary | ICD-10-CM | POA: Diagnosis not present

## 2022-12-16 DIAGNOSIS — M5416 Radiculopathy, lumbar region: Secondary | ICD-10-CM | POA: Diagnosis not present

## 2023-01-03 DIAGNOSIS — M5416 Radiculopathy, lumbar region: Secondary | ICD-10-CM | POA: Diagnosis not present

## 2023-01-10 DIAGNOSIS — M5416 Radiculopathy, lumbar region: Secondary | ICD-10-CM | POA: Diagnosis not present

## 2023-02-15 ENCOUNTER — Telehealth: Payer: Self-pay

## 2023-02-15 ENCOUNTER — Telehealth: Payer: Self-pay | Admitting: Podiatry

## 2023-02-15 NOTE — Telephone Encounter (Signed)
Left message for pt to call to confirm he wanted to be seen in [place] office.    Pts insurance has already been billed for the orthotics.

## 2023-02-15 NOTE — Telephone Encounter (Signed)
Spoke with pt we told him we lost his scans and we would have to rescan him. And he said if we can still bill his old ins. Bc he recently lost it and has no ins now.

## 2023-02-15 NOTE — Telephone Encounter (Signed)
 Pt called upset about his orthotics, he ordered them in sept of last year and has not received them. He states he has left messages and even tried to talk with office manager and no one has called him back. I did put him on hold trying to find out the secure chat and when I went back he did not answer me. I am going to go down and see if they are in and if they are I will call pt back.

## 2023-02-22 DIAGNOSIS — M5416 Radiculopathy, lumbar region: Secondary | ICD-10-CM | POA: Diagnosis not present

## 2023-03-04 ENCOUNTER — Ambulatory Visit: Payer: 59

## 2023-03-08 NOTE — Progress Notes (Signed)
 Orthotics   Patient was present and evaluated for Custom molded foot orthotics. Patient will benefit from CFO's to provide total contact to BIL MLA's helping to balance and distribute body weight more evenly across BIL feet helping to reduce plantar pressure and pain. Orthotic will also encourage FF / RF alignment  Patient was scanned today and will return for fitting upon receipt

## 2023-03-14 ENCOUNTER — Other Ambulatory Visit: Payer: 59

## 2023-03-24 ENCOUNTER — Telehealth: Payer: Self-pay

## 2023-03-24 NOTE — Telephone Encounter (Signed)
 Left vm for pt to schedule orthotic pick up, usually goes to [place] or [place], so be sure to ask where he would like appt.

## 2023-03-29 ENCOUNTER — Ambulatory Visit

## 2023-03-29 NOTE — Progress Notes (Signed)
Patient presents today to pick up custom molded foot orthotics, diagnosed with Pes Planus by Dr. Allena Katz.   Orthotics were dispensed and fit was satisfactory. Reviewed instructions for break-in and wear. Written instructions given to patient.  Patient will follow up as needed.   Alvin Johnson Cped, CFo, CFm

## 2023-04-21 ENCOUNTER — Other Ambulatory Visit (HOSPITAL_COMMUNITY): Payer: Self-pay

## 2023-04-21 MED ORDER — PANTOPRAZOLE SODIUM 40 MG PO TBEC
40.0000 mg | DELAYED_RELEASE_TABLET | Freq: Every day | ORAL | 0 refills | Status: AC
  Filled 2023-04-21: qty 90, 90d supply, fill #0

## 2023-05-24 ENCOUNTER — Other Ambulatory Visit (HOSPITAL_COMMUNITY): Payer: Self-pay

## 2023-05-24 MED ORDER — PANTOPRAZOLE SODIUM 20 MG PO TBEC
20.0000 mg | DELAYED_RELEASE_TABLET | Freq: Every day | ORAL | 0 refills | Status: AC
  Filled 2023-05-24: qty 90, 90d supply, fill #0

## 2023-07-25 DIAGNOSIS — S46212A Strain of muscle, fascia and tendon of other parts of biceps, left arm, initial encounter: Secondary | ICD-10-CM | POA: Diagnosis not present

## 2023-07-26 ENCOUNTER — Other Ambulatory Visit (HOSPITAL_COMMUNITY): Payer: Self-pay

## 2023-07-26 MED ORDER — METHOCARBAMOL 500 MG PO TABS
500.0000 mg | ORAL_TABLET | Freq: Three times a day (TID) | ORAL | 1 refills | Status: AC
  Filled 2023-07-26: qty 30, 10d supply, fill #0

## 2023-08-04 ENCOUNTER — Other Ambulatory Visit (HOSPITAL_COMMUNITY): Payer: Self-pay

## 2023-10-10 DIAGNOSIS — S0990XA Unspecified injury of head, initial encounter: Secondary | ICD-10-CM | POA: Diagnosis not present

## 2023-10-10 DIAGNOSIS — R519 Headache, unspecified: Secondary | ICD-10-CM | POA: Diagnosis not present

## 2023-10-10 DIAGNOSIS — L568 Other specified acute skin changes due to ultraviolet radiation: Secondary | ICD-10-CM | POA: Diagnosis not present

## 2023-10-11 ENCOUNTER — Ambulatory Visit (HOSPITAL_COMMUNITY)
Admission: RE | Admit: 2023-10-11 | Discharge: 2023-10-11 | Disposition: A | Discharge status: Home / Self Care | Source: Ambulatory Visit | Attending: Nurse Practitioner | Admitting: Nurse Practitioner

## 2023-10-11 ENCOUNTER — Other Ambulatory Visit (HOSPITAL_COMMUNITY): Payer: Self-pay | Admitting: Nurse Practitioner

## 2023-10-11 DIAGNOSIS — S0990XA Unspecified injury of head, initial encounter: Secondary | ICD-10-CM | POA: Diagnosis not present

## 2023-10-11 DIAGNOSIS — R519 Headache, unspecified: Secondary | ICD-10-CM

## 2023-11-10 DIAGNOSIS — R519 Headache, unspecified: Secondary | ICD-10-CM | POA: Diagnosis not present

## 2023-11-10 DIAGNOSIS — Z0000 Encounter for general adult medical examination without abnormal findings: Secondary | ICD-10-CM | POA: Diagnosis not present

## 2023-11-10 DIAGNOSIS — M5412 Radiculopathy, cervical region: Secondary | ICD-10-CM | POA: Diagnosis not present

## 2023-11-10 DIAGNOSIS — K529 Noninfective gastroenteritis and colitis, unspecified: Secondary | ICD-10-CM | POA: Diagnosis not present

## 2023-11-10 DIAGNOSIS — M5416 Radiculopathy, lumbar region: Secondary | ICD-10-CM | POA: Diagnosis not present

## 2023-11-16 ENCOUNTER — Other Ambulatory Visit: Payer: Self-pay | Admitting: *Deleted

## 2023-11-16 DIAGNOSIS — M542 Cervicalgia: Secondary | ICD-10-CM

## 2023-11-25 ENCOUNTER — Ambulatory Visit
Admission: RE | Admit: 2023-11-25 | Discharge: 2023-11-25 | Disposition: A | Discharge status: Home / Self Care | Source: Ambulatory Visit | Attending: *Deleted | Admitting: *Deleted

## 2023-11-25 DIAGNOSIS — M47812 Spondylosis without myelopathy or radiculopathy, cervical region: Secondary | ICD-10-CM | POA: Diagnosis not present

## 2023-11-25 DIAGNOSIS — M5021 Other cervical disc displacement,  high cervical region: Secondary | ICD-10-CM | POA: Diagnosis not present

## 2023-11-25 DIAGNOSIS — M542 Cervicalgia: Secondary | ICD-10-CM

## 2023-11-25 DIAGNOSIS — M50221 Other cervical disc displacement at C4-C5 level: Secondary | ICD-10-CM | POA: Diagnosis not present

## 2023-11-25 DIAGNOSIS — M50222 Other cervical disc displacement at C5-C6 level: Secondary | ICD-10-CM | POA: Diagnosis not present

## 2023-11-28 DIAGNOSIS — M5412 Radiculopathy, cervical region: Secondary | ICD-10-CM | POA: Diagnosis not present

## 2023-12-02 DIAGNOSIS — M5412 Radiculopathy, cervical region: Secondary | ICD-10-CM | POA: Diagnosis not present
